# Patient Record
Sex: Female | Born: 1995 | Race: Black or African American | Hispanic: No | Marital: Single | Smoking: Never smoker
Health system: Southern US, Community
[De-identification: ages and names within clinical notes are randomized; demographics above are authoritative.]

## PROBLEM LIST (undated history)

## (undated) DIAGNOSIS — S83511A Sprain of anterior cruciate ligament of right knee, initial encounter: Secondary | ICD-10-CM

## (undated) DIAGNOSIS — Z98811 Dental restoration status: Secondary | ICD-10-CM

## (undated) DIAGNOSIS — S83249A Other tear of medial meniscus, current injury, unspecified knee, initial encounter: Secondary | ICD-10-CM

## (undated) HISTORY — PX: WISDOM TOOTH EXTRACTION: SHX21

---

## 2014-08-16 ENCOUNTER — Emergency Department (HOSPITAL_COMMUNITY)
Admission: EM | Admit: 2014-08-16 | Discharge: 2014-08-17 | Discharge status: Against Medical Advice | Payer: Medicaid Other | Attending: Emergency Medicine | Admitting: Emergency Medicine

## 2014-08-16 ENCOUNTER — Encounter (HOSPITAL_COMMUNITY): Payer: Self-pay | Admitting: Emergency Medicine

## 2014-08-16 DIAGNOSIS — R51 Headache: Secondary | ICD-10-CM | POA: Insufficient documentation

## 2014-08-16 LAB — COMPREHENSIVE METABOLIC PANEL
ALBUMIN: 4.5 g/dL (ref 3.5–5.0)
ALT: 21 U/L (ref 14–54)
ANION GAP: 11 (ref 5–15)
AST: 23 U/L (ref 15–41)
Alkaline Phosphatase: 51 U/L (ref 38–126)
BUN: 9 mg/dL (ref 6–20)
CALCIUM: 9.8 mg/dL (ref 8.9–10.3)
CO2: 25 mmol/L (ref 22–32)
CREATININE: 0.84 mg/dL (ref 0.44–1.00)
Chloride: 103 mmol/L (ref 101–111)
GFR calc Af Amer: >60 mL/min (ref >60)
GLUCOSE: 96 mg/dL (ref 65–99)
POTASSIUM: 3.6 mmol/L (ref 3.5–5.1)
SODIUM: 139 mmol/L (ref 135–145)
Total Bilirubin: 0.7 mg/dL (ref 0.3–1.2)
Total Protein: 7.5 g/dL (ref 6.5–8.1)

## 2014-08-16 LAB — CBC WITH DIFFERENTIAL/PLATELET
Basophils Absolute: 0 10*3/uL (ref 0.0–0.1)
Basophils Relative: 0 % (ref 0–1)
Eosinophils Absolute: 0.1 10*3/uL (ref 0.0–0.7)
Eosinophils Relative: 2 % (ref 0–5)
HCT: 36 % (ref 36.0–46.0)
Hemoglobin: 12.1 g/dL (ref 12.0–15.0)
Lymphocytes Relative: 57 % — ABNORMAL HIGH (ref 12–46)
Lymphs Abs: 3.1 10*3/uL (ref 0.7–4.0)
MCH: 27.6 pg (ref 26.0–34.0)
MCHC: 33.6 g/dL (ref 30.0–36.0)
MCV: 82.2 fL (ref 78.0–100.0)
MONO ABS: 0.3 10*3/uL (ref 0.1–1.0)
Monocytes Relative: 5 % (ref 3–12)
NEUTROS PCT: 36 % — AB (ref 43–77)
Neutro Abs: 1.9 10*3/uL (ref 1.7–7.7)
Platelets: 232 10*3/uL (ref 150–400)
RBC: 4.38 MIL/uL (ref 3.87–5.11)
RDW: 12.1 % (ref 11.5–15.5)
WBC: 5.3 10*3/uL (ref 4.0–10.5)

## 2014-08-16 LAB — URINALYSIS, ROUTINE W REFLEX MICROSCOPIC
Bilirubin Urine: NEGATIVE
Glucose, UA: NEGATIVE mg/dL
Hgb urine dipstick: NEGATIVE
KETONES UR: 15 mg/dL — AB
LEUKOCYTES UA: NEGATIVE
NITRITE: NEGATIVE
PROTEIN: NEGATIVE mg/dL
SPECIFIC GRAVITY, URINE: 1.026 (ref 1.005–1.030)
UROBILINOGEN UA: 1 mg/dL (ref 0.0–1.0)
pH: 7 (ref 5.0–8.0)

## 2014-08-16 LAB — POC URINE PREG, ED: PREG TEST UR: NEGATIVE

## 2014-08-16 NOTE — ED Notes (Addendum)
Pt. reports headache with nausea and photophobia onset today unrelieved by Aspirin , denies head injury / no blurred vision .

## 2014-08-17 NOTE — ED Notes (Signed)
Called pt to be brought back to room, no response X3.

## 2014-09-28 ENCOUNTER — Emergency Department (HOSPITAL_COMMUNITY)
Admission: EM | Admit: 2014-09-28 | Discharge: 2014-09-28 | Disposition: A | Discharge status: Home / Self Care | Payer: Medicaid Other | Attending: Emergency Medicine | Admitting: Emergency Medicine

## 2014-09-28 ENCOUNTER — Encounter (HOSPITAL_COMMUNITY): Payer: Self-pay

## 2014-09-28 DIAGNOSIS — Z3202 Encounter for pregnancy test, result negative: Secondary | ICD-10-CM | POA: Insufficient documentation

## 2014-09-28 DIAGNOSIS — N939 Abnormal uterine and vaginal bleeding, unspecified: Secondary | ICD-10-CM | POA: Diagnosis present

## 2014-09-28 DIAGNOSIS — R81 Glycosuria: Secondary | ICD-10-CM

## 2014-09-28 DIAGNOSIS — R739 Hyperglycemia, unspecified: Secondary | ICD-10-CM | POA: Diagnosis not present

## 2014-09-28 LAB — URINALYSIS, ROUTINE W REFLEX MICROSCOPIC
Bilirubin Urine: NEGATIVE
Glucose, UA: >1000 mg/dL — AB
NITRITE: NEGATIVE
PROTEIN: NEGATIVE mg/dL
Specific Gravity, Urine: 1.027 (ref 1.005–1.030)
Urobilinogen, UA: 0.2 mg/dL (ref 0.0–1.0)
pH: 5 (ref 5.0–8.0)

## 2014-09-28 LAB — URINE MICROSCOPIC-ADD ON

## 2014-09-28 LAB — WET PREP, GENITAL
CLUE CELLS WET PREP: NONE SEEN
TRICH WET PREP: NONE SEEN
Yeast Wet Prep HPF POC: NONE SEEN

## 2014-09-28 LAB — I-STAT BETA HCG BLOOD, ED (MC, WL, AP ONLY): I-stat hCG, quantitative: <5 m[IU]/mL (ref <5)

## 2014-09-28 LAB — CBG MONITORING, ED: GLUCOSE-CAPILLARY: 109 mg/dL — AB (ref 65–99)

## 2014-09-28 MED ORDER — IBUPROFEN 800 MG PO TABS: 800.0000 mg | ORAL_TABLET | Freq: Three times a day (TID) | ORAL | Status: DC

## 2014-09-28 MED ORDER — IBUPROFEN 800 MG PO TABS
800.0000 mg | ORAL_TABLET | Freq: Once | ORAL | Status: AC
  Administered 2014-09-28: 800 mg via ORAL
  Filled 2014-09-28: qty 1

## 2014-09-28 NOTE — ED Notes (Signed)
No period for three months and negative pregnancy test, today reports woke up with puddle of blood noted,denies clots. Cramps worse than normal also.

## 2014-09-28 NOTE — ED Notes (Signed)
Arthor Captain PA collected the wet prep genital

## 2014-09-28 NOTE — Discharge Instructions (Signed)
Abnormal Uterine Bleeding Abnormal uterine bleeding can affect women at various stages in life, including teenagers, women in their reproductive years, pregnant women, and women who have reached menopause. Several kinds of uterine bleeding are considered abnormal, including:  Bleeding or spotting between periods.   Bleeding after sexual intercourse.   Bleeding that is heavier or more than normal.   Periods that last longer than usual.  Bleeding after menopause.  Many cases of abnormal uterine bleeding are minor and simple to treat, while others are more serious. Any type of abnormal bleeding should be evaluated by your health care provider. Treatment will depend on the cause of the bleeding. HOME CARE INSTRUCTIONS Monitor your condition for any changes. The following actions may help to alleviate any discomfort you are experiencing:  Avoid the use of tampons and douches as directed by your health care provider.  Change your pads frequently. You should get regular pelvic exams and Pap tests. Keep all follow-up appointments for diagnostic tests as directed by your health care provider.  SEEK MEDICAL CARE IF:   Your bleeding lasts more than 1 week.   You feel dizzy at times.  SEEK IMMEDIATE MEDICAL CARE IF:   You pass out.   You are changing pads every 15 to 30 minutes.   You have abdominal pain.  You have a fever.   You become sweaty or weak.   You are passing large blood clots from the vagina.   You start to feel nauseous and vomit. MAKE SURE YOU:   Understand these instructions.  Will watch your condition.  Will get help right away if you are not doing well or get worse. Document Released: 12/19/2004 Document Revised: 12/24/2012 Document Reviewed: 07/18/2012 [facility] Patient Information 2015 [place], [place]. This information is not intended to replace advice given to you by your health care provider. Make sure you discuss any questions you have with your  health care provider.  Hyperglycemia Hyperglycemia occurs when the glucose (sugar) in your blood is too high. Hyperglycemia can happen for many reasons, but it most often happens to people who do not know they have diabetes or are not managing their diabetes properly.  CAUSES  Whether you have diabetes or not, there are other causes of hyperglycemia. Hyperglycemia can occur when you have diabetes, but it can also occur in other situations that you might not be as aware of, such as: Diabetes  If you have diabetes and are having problems controlling your blood glucose, hyperglycemia could occur because of some of the following reasons:  Not following your meal plan.  Not taking your diabetes medications or not taking it properly.  Exercising less or doing less activity than you normally do.  Being sick. Pre-diabetes  This cannot be ignored. Before people develop Type 2 diabetes, they almost always have "pre-diabetes." This is when your blood glucose levels are higher than normal, but not yet high enough to be diagnosed as diabetes. Research has shown that some long-term damage to the body, especially the heart and circulatory system, may already be occurring during pre-diabetes. If you take action to manage your blood glucose when you have pre-diabetes, you may delay or prevent Type 2 diabetes from developing. Stress  If you have diabetes, you may be "diet" controlled or on oral medications or insulin to control your diabetes. However, you may find that your blood glucose is higher than usual in the hospital whether you have diabetes or not. This is often referred to as "stress hyperglycemia." Stress  can elevate your blood glucose. This happens because of hormones put out by the body during times of stress. If stress has been the cause of your high blood glucose, it can be followed regularly by your caregiver. That way he/she can make sure your hyperglycemia does not continue to get worse or  progress to diabetes. Steroids  Steroids are medications that act on the infection fighting system (immune system) to block inflammation or infection. One side effect can be a rise in blood glucose. Most people can produce enough extra insulin to allow for this rise, but for those who cannot, steroids make blood glucose levels go even higher. It is not unusual for steroid treatments to "uncover" diabetes that is developing. It is not always possible to determine if the hyperglycemia will go away after the steroids are stopped. A special blood test called an A1c is sometimes done to determine if your blood glucose was elevated before the steroids were started. SYMPTOMS  Thirsty.  Frequent urination.  Dry mouth.  Blurred vision.  Tired or fatigue.  Weakness.  Sleepy.  Tingling in feet or leg. DIAGNOSIS  Diagnosis is made by monitoring blood glucose in one or all of the following ways:  A1c test. This is a chemical found in your blood.  Fingerstick blood glucose monitoring.  Laboratory results. TREATMENT  First, knowing the cause of the hyperglycemia is important before the hyperglycemia can be treated. Treatment may include, but is not be limited to:  Education.  Change or adjustment in medications.  Change or adjustment in meal plan.  Treatment for an illness, infection, etc.  More frequent blood glucose monitoring.  Change in exercise plan.  Decreasing or stopping steroids.  Lifestyle changes. HOME CARE INSTRUCTIONS   Test your blood glucose as directed.  Exercise regularly. Your caregiver will give you instructions about exercise. Pre-diabetes or diabetes which comes on with stress is helped by exercising.  Eat wholesome, balanced meals. Eat often and at regular, fixed times. Your caregiver or nutritionist will give you a meal plan to guide your sugar intake.  Being at an ideal weight is important. If needed, losing as little as 10 to 15 pounds may help improve  blood glucose levels. SEEK MEDICAL CARE IF:   You have questions about medicine, activity, or diet.  You continue to have symptoms (problems such as increased thirst, urination, or weight gain). SEEK IMMEDIATE MEDICAL CARE IF:   You are vomiting or have diarrhea.  Your breath smells fruity.  You are breathing faster or slower.  You are very sleepy or incoherent.  You have numbness, tingling, or pain in your feet or hands.  You have chest pain.  Your symptoms get worse even though you have been following your caregiver's orders.  If you have any other questions or concerns. Document Released: 06/14/2000 Document Revised: 03/13/2011 Document Reviewed: 04/17/2011 [facility] Patient Information 2015 [place], [place]. This information is not intended to replace advice given to you by your health care provider. Make sure you discuss any questions you have with your health care provider.

## 2014-09-28 NOTE — ED Provider Notes (Signed)
CSN: [id number]     Arrival date & time 09/28/14  4098 History   First MD Initiated Contact with Patient 09/28/14 [phone]     Chief Complaint  Patient presents with  . Abdominal Pain  . Vaginal Bleeding     (Consider location/radiation/quality/duration/timing/severity/associated sxs/prior Treatment) Patient is a 19 y.o. female presenting with abdominal pain and vaginal bleeding. The history is provided by the patient.  Abdominal Pain Pain location:  Suprapubic Pain quality: aching and cramping   Pain radiates to:  Does not radiate Pain severity:  Moderate Onset quality:  Sudden Duration:  3 hours Timing:  Constant Progression:  Unchanged Chronicity:  New Context: not alcohol use, not awakening from sleep, not diet changes, not eating, not laxative use, not medication withdrawal, not previous surgeries, not recent illness, not recent sexual activity, not recent travel, not retching, not sick contacts, not suspicious food intake and not trauma   Relieved by:  None tried Worsened by:  Nothing tried Ineffective treatments:  None tried Associated symptoms: vaginal bleeding   Associated symptoms: no anorexia, no belching, no chest pain, no chills, no constipation, no cough, no diarrhea, no dysuria, no fatigue, no fever, no flatus, no hematemesis, no hematochezia, no hematuria, no melena, no nausea, no shortness of breath, no sore throat, no vaginal discharge and no vomiting   Vaginal bleeding:    Quality:  Bright red   Severity:  Moderate   Number of tampons used:  2 since onset thisw morning   Onset quality:  Sudden   Duration:  3 hours   Progression:  Unchanged   Chronicity:  New Risk factors: no alcohol abuse, no aspirin use, not elderly, has not had multiple surgeries, no NSAID use, not obese, not pregnant and no recent hospitalization   Vaginal Bleeding Menstrual history:  Missed period (3months no period) Possible pregnancy: no   Context: spontaneously   Associated symptoms:  abdominal pain   Associated symptoms: no dysuria, no fatigue, no fever, no nausea and no vaginal discharge   Risk factors: no bleeding disorder, no hx of ectopic pregnancy, no hx of endometriosis, no gynecological surgery, does not have multiple partners, no new sexual partner, no ovarian cysts, no ovarian torsion, no PID, no prior miscarriage, no STD, no STD exposure and no terminated pregnancies     History reviewed. No pertinent past medical history. History reviewed. No pertinent past surgical history. History reviewed. No pertinent family history. Social History  Substance Use Topics  . Smoking status: Never Smoker   . Smokeless tobacco: None  . Alcohol Use: No   OB History    No data available     Review of Systems  Constitutional: Negative for fever, chills and fatigue.  HENT: Negative for sore throat.   Respiratory: Negative for cough and shortness of breath.   Cardiovascular: Negative for chest pain.  Gastrointestinal: Positive for abdominal pain. Negative for nausea, vomiting, diarrhea, constipation, melena, hematochezia, anorexia, flatus and hematemesis.  Genitourinary: Positive for frequency and vaginal bleeding. Negative for dysuria, hematuria and vaginal discharge.  All other systems reviewed and are negative.     Allergies  Review of patient's allergies indicates no known allergies.  Home Medications   Prior to Admission medications   Medication Sig Start Date End Date Taking? Authorizing Provider  ibuprofen (ADVIL,MOTRIN) 800 MG tablet Take 1 tablet (800 mg total) by mouth 3 (three) times daily. 09/28/14   Callista Hoh, PA-C   BP 122/59 mmHg  Pulse 84  Temp(Src) 98.3 F (36.8  C) (Oral)  Resp 16  Ht  (1.626 m)  Wt 150 lb (68.04 kg)  BMI 25.73 kg/m2  SpO2 98% Physical Exam  Constitutional: She is oriented to person, place, and time. She appears well-developed and well-nourished. No distress.  HENT:  Head: Normocephalic and atraumatic.  Eyes:  Conjunctivae are normal. No scleral icterus.  Neck: Normal range of motion.  Cardiovascular: Normal rate, regular rhythm and normal heart sounds.  Exam reveals no gallop and no friction rub.   No murmur heard. Pulmonary/Chest: Effort normal and breath sounds normal. No respiratory distress.  Abdominal: Soft. Bowel sounds are normal. She exhibits no distension and no mass. There is no tenderness. There is no guarding.  Genitourinary:  Pelvic exam: normal external genitalia, vulva, vagina, cervix, uterus and adnexa. Generalized pain and discomfort. No adnexal pain/fullness, no CMT. minmal bleeding from the cervical os   Neurological: She is alert and oriented to person, place, and time.  Skin: Skin is warm and dry. She is not diaphoretic.    ED Course  Procedures (including critical care time) Labs Review Labs Reviewed  WET PREP, GENITAL - Abnormal; Notable for the following:    WBC, Wet Prep HPF POC FEW (*)    All other components within normal limits  URINALYSIS, ROUTINE W REFLEX MICROSCOPIC (NOT AT [facility]) - Abnormal; Notable for the following:    APPearance CLOUDY (*)    Glucose, UA >1000 (*)    Hgb urine dipstick SMALL (*)    Ketones, ur >80 (*)    Leukocytes, UA SMALL (*)    All other components within normal limits  URINE MICROSCOPIC-ADD ON - Abnormal; Notable for the following:    Squamous Epithelial / LPF FEW (*)    All other components within normal limits  CBG MONITORING, ED - Abnormal; Notable for the following:    Glucose-Capillary 109 (*)    All other components within normal limits  I-STAT BETA HCG BLOOD, ED ([facility], [facility], [facility] ONLY)  I-STAT BETA HCG BLOOD, ED ([facility], [facility], [facility] ONLY)  GC/CHLAMYDIA PROBE AMP ([place]) NOT AT [facility]    Imaging Review No results found. I have personally reviewed and evaluated these images and lab results as part of my medical decision-making.   EKG Interpretation None      MDM   Final diagnoses:  Abnormal vaginal bleeding  Hyperglycemia   Glucosuria    19year-old female who presents emergency Department with chief complaint of cramping and vaginal bleeding. Patient states that she had heavy vaginal bleeding that began this morning. She states that she has already bled through 2 super tampons this morning. She states that these cramps are worse than other previous menstrual cramps that she's had. She denies any previous symptoms such as vaginal discharge, foul odor, dyspareunia. She states that she has not had a period in 3 months, but has no previous history of abnormal vaginal bleeding. She denies any urinary symptoms except for frequency of urination. She states she has had previous urinary tract infections, but this does not feel like she has one. She denies fevers, chills, myalgias.  Patient has a benign pelvic examination. Urine appears contaminated. She does have high glucose urea. CBG reveals an elevated glucose at 109. I discussed these findings with the patient. She states she has a follow-up appointment with her primary care physician this coming Friday in 4 days. I did advise her that she will need further evaluation of her blood sugar.patient appears safe for discharge, I have discussed  return precautio  Arthor Captain, PA-C 09/28/14 1956  Gilda Crease, MD 09/29/14 2052

## 2014-09-29 LAB — GC/CHLAMYDIA PROBE AMP (~~LOC~~) NOT AT ARMC
Chlamydia: NEGATIVE
Neisseria Gonorrhea: NEGATIVE

## 2014-11-03 DIAGNOSIS — S83511A Sprain of anterior cruciate ligament of right knee, initial encounter: Secondary | ICD-10-CM

## 2014-11-03 DIAGNOSIS — S83249A Other tear of medial meniscus, current injury, unspecified knee, initial encounter: Secondary | ICD-10-CM

## 2014-11-03 HISTORY — DX: Other tear of medial meniscus, current injury, unspecified knee, initial encounter: S83.249A

## 2014-11-03 HISTORY — DX: Sprain of anterior cruciate ligament of right knee, initial encounter: S83.511A

## 2014-11-13 ENCOUNTER — Other Ambulatory Visit: Payer: Self-pay | Admitting: Orthopedic Surgery

## 2014-11-16 ENCOUNTER — Encounter (HOSPITAL_BASED_OUTPATIENT_CLINIC_OR_DEPARTMENT_OTHER): Payer: Self-pay | Admitting: *Deleted

## 2014-11-20 NOTE — H&P (Signed)
Amy Neal is an 19 y.o. female.    Chief Complaint: Left ACL tear  HPI: Patient returns today with continued instability of her right knee.  An MRI scan is been accomplished showing a chronic ACL deficiency as well as what looks like a markedly diminutive medial meniscus and peripheral longitudinal tear of the medial meniscus.  At age 19.  The patient desires to be active and is interested in reconstruction.  Past Medical History  Diagnosis Date  . Right ACL tear 11/2014  . Medial meniscus tear 11/2014    right knee  . Dental crowns present     also dental caps    Past Surgical History  Procedure Laterality Date  . Wisdom tooth extraction      History reviewed. No pertinent family history. Social History:  reports that she has never smoked. She has never used smokeless tobacco. She reports that she uses illicit drugs (Marijuana). She reports that she does not drink alcohol.  Allergies:  Allergies  Allergen Reactions  . Shrimp [Shellfish Allergy] Hives    TONGUE SWELLING, THROAT ITCHING    No prescriptions prior to admission    No results found for this or any previous visit (from the past 48 hour(s)). No results found.  Review of Systems  Constitutional: Positive for malaise/fatigue.  Eyes: Negative.   Respiratory: Positive for shortness of breath.   Cardiovascular: Negative.   Gastrointestinal: Positive for nausea, vomiting and abdominal pain.       Poor appetite, bowel changes  Genitourinary: Positive for frequency.  Musculoskeletal: Positive for myalgias and joint pain.  Skin: Negative.   Neurological: Positive for headaches.  Endo/Heme/Allergies: Negative.   Psychiatric/Behavioral: Negative.     Height 5\' 4"  (1.626 m), weight 69.854 kg (154 lb), last menstrual period 11/09/2014. Physical Exam  Constitutional: She is oriented to person, place, and time. She appears well-developed and well-nourished.  HENT:  Head: Normocephalic and atraumatic.  Eyes:  Pupils are equal, round, and reactive to light.  Neck: Normal range of motion. Neck supple.  Cardiovascular: Intact distal pulses.   Respiratory: Effort normal.  Musculoskeletal: She exhibits tenderness.  Lachman's test remains negative.  Tender along the medial joint line the MRI scan is reviewed in detail with the patient.  Neurological: She is alert and oriented to person, place, and time.  Skin: Skin is warm and dry.  Psychiatric: She has a normal mood and affect. Her behavior is normal. Judgment and thought content normal.     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  Plan: Risks and benefits of autograft ACL reconstruction were discussed at length with the patient.  We'll get this arranged for her at her convenience.  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  She understands the need for physical therapy afterwards, and we'll need 3-4 months for healing total.  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  Amy Neal 11/20/2014, 10:06 AM

## 2014-11-23 ENCOUNTER — Encounter (HOSPITAL_BASED_OUTPATIENT_CLINIC_OR_DEPARTMENT_OTHER): Payer: Self-pay | Admitting: Anesthesiology

## 2014-11-23 ENCOUNTER — Ambulatory Visit (HOSPITAL_BASED_OUTPATIENT_CLINIC_OR_DEPARTMENT_OTHER): Payer: Medicaid Other | Admitting: Certified Registered"

## 2014-11-23 ENCOUNTER — Ambulatory Visit (HOSPITAL_BASED_OUTPATIENT_CLINIC_OR_DEPARTMENT_OTHER)
Admission: RE | Admit: 2014-11-23 | Discharge: 2014-11-23 | Disposition: A | Discharge status: Home / Self Care | Payer: Medicaid Other | Source: Ambulatory Visit | Attending: Orthopedic Surgery | Admitting: Orthopedic Surgery

## 2014-11-23 ENCOUNTER — Encounter (HOSPITAL_BASED_OUTPATIENT_CLINIC_OR_DEPARTMENT_OTHER)
Admission: RE | Disposition: A | Discharge status: Home / Self Care | Payer: Self-pay | Source: Ambulatory Visit | Attending: Orthopedic Surgery

## 2014-11-23 DIAGNOSIS — S83241A Other tear of medial meniscus, current injury, right knee, initial encounter: Secondary | ICD-10-CM | POA: Diagnosis not present

## 2014-11-23 DIAGNOSIS — S83511A Sprain of anterior cruciate ligament of right knee, initial encounter: Secondary | ICD-10-CM | POA: Diagnosis present

## 2014-11-23 DIAGNOSIS — Z91013 Allergy to seafood: Secondary | ICD-10-CM | POA: Diagnosis not present

## 2014-11-23 HISTORY — DX: Sprain of anterior cruciate ligament of right knee, initial encounter: S83.511A

## 2014-11-23 HISTORY — DX: Other tear of medial meniscus, current injury, unspecified knee, initial encounter: S83.249A

## 2014-11-23 HISTORY — PX: KNEE ARTHROSCOPY WITH ANTERIOR CRUCIATE LIGAMENT (ACL) REPAIR: SHX5644

## 2014-11-23 HISTORY — DX: Dental restoration status: Z98.811

## 2014-11-23 HISTORY — PX: KNEE ARTHROSCOPY WITH MEDIAL MENISECTOMY: SHX5651

## 2014-11-23 SURGERY — KNEE ARTHROSCOPY WITH ANTERIOR CRUCIATE LIGAMENT (ACL) REPAIR
Anesthesia: Regional | Site: Knee | Laterality: Right

## 2014-11-23 MED ORDER — ONDANSETRON 8 MG PO TBDP: 8.0000 mg | ORAL_TABLET | Freq: Once | ORAL | Status: DC

## 2014-11-23 MED ORDER — MEPERIDINE HCL 25 MG/ML IJ SOLN: 6.2500 mg | INTRAMUSCULAR | Status: DC | PRN

## 2014-11-23 MED ORDER — GLYCOPYRROLATE 0.2 MG/ML IJ SOLN: 0.2000 mg | Freq: Once | INTRAMUSCULAR | Status: DC | PRN

## 2014-11-23 MED ORDER — FENTANYL CITRATE (PF) 100 MCG/2ML IJ SOLN
50.0000 ug | INTRAMUSCULAR | Status: AC | PRN
  Administered 2014-11-23: 100 ug via INTRAVENOUS
  Administered 2014-11-23 (×2): 50 ug via INTRAVENOUS

## 2014-11-23 MED ORDER — BUPIVACAINE-EPINEPHRINE (PF) 0.5% -1:200000 IJ SOLN
INTRAMUSCULAR | Status: AC
  Filled 2014-11-23: qty 30

## 2014-11-23 MED ORDER — MIDAZOLAM HCL 2 MG/2ML IJ SOLN
INTRAMUSCULAR | Status: AC
  Filled 2014-11-23: qty 2

## 2014-11-23 MED ORDER — CHLORHEXIDINE GLUCONATE 4 % EX LIQD: 60.0000 mL | Freq: Once | CUTANEOUS | Status: DC

## 2014-11-23 MED ORDER — MORPHINE SULFATE (PF) 10 MG/ML IV SOLN
INTRAVENOUS | Status: AC
  Filled 2014-11-23: qty 1

## 2014-11-23 MED ORDER — HYDROMORPHONE HCL 1 MG/ML IJ SOLN
INTRAMUSCULAR | Status: AC
  Filled 2014-11-23: qty 1

## 2014-11-23 MED ORDER — CEFAZOLIN SODIUM-DEXTROSE 2-3 GM-% IV SOLR
2.0000 g | INTRAVENOUS | Status: AC
  Administered 2014-11-23: 2 g via INTRAVENOUS

## 2014-11-23 MED ORDER — ONDANSETRON 8 MG PO TBDP
8.0000 mg | ORAL_TABLET | Freq: Once | ORAL | Status: AC
  Administered 2014-11-23: 8 mg via ORAL

## 2014-11-23 MED ORDER — OXYCODONE-ACETAMINOPHEN 5-325 MG PO TABS: 1.0000 | ORAL_TABLET | ORAL | Status: DC | PRN

## 2014-11-23 MED ORDER — PROMETHAZINE HCL 25 MG/ML IJ SOLN: 6.2500 mg | INTRAMUSCULAR | Status: DC | PRN

## 2014-11-23 MED ORDER — DEXAMETHASONE SODIUM PHOSPHATE 4 MG/ML IJ SOLN
INTRAMUSCULAR | Status: DC | PRN
  Administered 2014-11-23: 10 mg via INTRAVENOUS

## 2014-11-23 MED ORDER — PROPOFOL 10 MG/ML IV BOLUS
INTRAVENOUS | Status: DC | PRN
  Administered 2014-11-23: 200 mg via INTRAVENOUS

## 2014-11-23 MED ORDER — ONDANSETRON HCL 4 MG PO TABS: 4.0000 mg | ORAL_TABLET | Freq: Four times a day (QID) | ORAL | Status: DC | PRN

## 2014-11-23 MED ORDER — LACTATED RINGERS IV SOLN
INTRAVENOUS | Status: DC
  Administered 2014-11-23: 09:00:00 via INTRAVENOUS

## 2014-11-23 MED ORDER — MIDAZOLAM HCL 2 MG/2ML IJ SOLN
1.0000 mg | INTRAMUSCULAR | Status: DC | PRN
  Administered 2014-11-23 (×2): 2 mg via INTRAVENOUS

## 2014-11-23 MED ORDER — HYDROMORPHONE HCL 1 MG/ML IJ SOLN: 0.2500 mg | INTRAMUSCULAR | Status: DC | PRN

## 2014-11-23 MED ORDER — FENTANYL CITRATE (PF) 100 MCG/2ML IJ SOLN
INTRAMUSCULAR | Status: AC
  Filled 2014-11-23: qty 2

## 2014-11-23 MED ORDER — HYDROMORPHONE HCL 1 MG/ML IJ SOLN
0.2500 mg | INTRAMUSCULAR | Status: DC | PRN
  Administered 2014-11-23 (×3): 0.5 mg via INTRAVENOUS

## 2014-11-23 MED ORDER — SCOPOLAMINE 1 MG/3DAYS TD PT72: 1.0000 | MEDICATED_PATCH | Freq: Once | TRANSDERMAL | Status: DC | PRN

## 2014-11-23 MED ORDER — BUPIVACAINE HCL (PF) 0.5 % IJ SOLN
INTRAMUSCULAR | Status: AC
  Filled 2014-11-23: qty 30

## 2014-11-23 MED ORDER — ONDANSETRON HCL 4 MG/2ML IJ SOLN: 4.0000 mg | Freq: Four times a day (QID) | INTRAMUSCULAR | Status: DC | PRN

## 2014-11-23 MED ORDER — LIDOCAINE HCL (CARDIAC) 20 MG/ML IV SOLN
INTRAVENOUS | Status: DC | PRN
  Administered 2014-11-23: 50 mg via INTRAVENOUS

## 2014-11-23 MED ORDER — METOCLOPRAMIDE HCL 5 MG PO TABS: 5.0000 mg | ORAL_TABLET | Freq: Three times a day (TID) | ORAL | Status: DC | PRN

## 2014-11-23 MED ORDER — METOCLOPRAMIDE HCL 5 MG/ML IJ SOLN: 5.0000 mg | Freq: Three times a day (TID) | INTRAMUSCULAR | Status: DC | PRN

## 2014-11-23 MED ORDER — EPINEPHRINE HCL 1 MG/ML IJ SOLN
INTRAMUSCULAR | Status: AC
  Filled 2014-11-23: qty 1

## 2014-11-23 MED ORDER — ONDANSETRON HCL 4 MG/2ML IJ SOLN
INTRAMUSCULAR | Status: DC | PRN
  Administered 2014-11-23: 4 mg via INTRAVENOUS

## 2014-11-23 MED ORDER — MORPHINE SULFATE (PF) 10 MG/ML IV SOLN
INTRAVENOUS | Status: DC | PRN
  Administered 2014-11-23 (×2): 2 mg via BUCCAL

## 2014-11-23 MED ORDER — CEFAZOLIN SODIUM-DEXTROSE 2-3 GM-% IV SOLR
INTRAVENOUS | Status: AC
  Filled 2014-11-23: qty 50

## 2014-11-23 MED ORDER — SODIUM CHLORIDE 0.9 % IR SOLN
Status: DC | PRN
  Administered 2014-11-23: 12:00:00

## 2014-11-23 MED ORDER — ONDANSETRON 8 MG PO TBDP
ORAL_TABLET | ORAL | Status: AC
  Filled 2014-11-23: qty 1

## 2014-11-23 SURGICAL SUPPLY — 67 items
BANDAGE ELASTIC 4 VELCRO ST LF (GAUZE/BANDAGES/DRESSINGS) ×4 IMPLANT
BANDAGE ELASTIC 6 VELCRO ST LF (GAUZE/BANDAGES/DRESSINGS) ×4 IMPLANT
BANDAGE ESMARK 6X9 LF (GAUZE/BANDAGES/DRESSINGS) ×2 IMPLANT
BLADE AVERAGE 25MMX9MM (BLADE) ×1
BLADE AVERAGE 25X9 (BLADE) ×3 IMPLANT
BLADE CUTTER GATOR 3.5 (BLADE) ×4 IMPLANT
BLADE GREAT WHITE 4.2 (BLADE) ×3 IMPLANT
BLADE GREAT WHITE 4.2MM (BLADE) ×1
BLADE SURG 10 STRL SS (BLADE) ×4 IMPLANT
BLADE SURG 15 STRL LF DISP TIS (BLADE) IMPLANT
BLADE SURG 15 STRL SS (BLADE)
BNDG COHESIVE 6X5 TAN STRL LF (GAUZE/BANDAGES/DRESSINGS) ×4 IMPLANT
BNDG ESMARK 6X9 LF (GAUZE/BANDAGES/DRESSINGS) ×4
BUR VERTEX HOODED 4.5 (BURR) ×4 IMPLANT
COVER BACK TABLE 60X90IN (DRAPES) ×4 IMPLANT
CUFF TOURNIQUET SINGLE 34IN LL (TOURNIQUET CUFF) ×4 IMPLANT
DECANTER SPIKE VIAL GLASS SM (MISCELLANEOUS) IMPLANT
DRAPE ARTHROSCOPY W/POUCH 114 (DRAPES) ×4 IMPLANT
DRAPE U-SHAPE 47X51 STRL (DRAPES) IMPLANT
DURAPREP 26ML APPLICATOR (WOUND CARE) ×4 IMPLANT
ELECT REM PT RETURN 9FT ADLT (ELECTROSURGICAL) ×4
ELECTRODE REM PT RTRN 9FT ADLT (ELECTROSURGICAL) ×2 IMPLANT
GAUZE SPONGE 4X4 12PLY STRL (GAUZE/BANDAGES/DRESSINGS) ×4 IMPLANT
GAUZE XEROFORM 1X8 LF (GAUZE/BANDAGES/DRESSINGS) ×4 IMPLANT
GLOVE BIO SURGEON STRL SZ7.5 (GLOVE) ×4 IMPLANT
GLOVE BIO SURGEON STRL SZ8.5 (GLOVE) ×4 IMPLANT
GLOVE BIOGEL PI IND STRL 8 (GLOVE) ×2 IMPLANT
GLOVE BIOGEL PI IND STRL 9 (GLOVE) ×2 IMPLANT
GLOVE BIOGEL PI INDICATOR 8 (GLOVE) ×2
GLOVE BIOGEL PI INDICATOR 9 (GLOVE) ×2
GOWN STRL REUS W/ TWL LRG LVL3 (GOWN DISPOSABLE) ×4 IMPLANT
GOWN STRL REUS W/TWL LRG LVL3 (GOWN DISPOSABLE) ×4
GOWN STRL REUS W/TWL XL LVL3 (GOWN DISPOSABLE) ×4 IMPLANT
IMMOBILIZER KNEE 22 UNIV (SOFTGOODS) ×4 IMPLANT
IMMOBILIZER KNEE 24 THIGH 36 (MISCELLANEOUS) IMPLANT
IMMOBILIZER KNEE 24 UNIV (MISCELLANEOUS)
IV NS IRRIG 3000ML ARTHROMATIC (IV SOLUTION) ×8 IMPLANT
KNEE WRAP E Z 3 GEL PACK (MISCELLANEOUS) ×4 IMPLANT
MANIFOLD NEPTUNE II (INSTRUMENTS) ×4 IMPLANT
NDL SAFETY ECLIPSE 18X1.5 (NEEDLE) ×2 IMPLANT
NEEDLE HYPO 18GX1.5 SHARP (NEEDLE) ×2
NS IRRIG 1000ML POUR BTL (IV SOLUTION) ×4 IMPLANT
PACK ARTHROSCOPY DSU (CUSTOM PROCEDURE TRAY) ×4 IMPLANT
PAD CAST 4YDX4 CTTN HI CHSV (CAST SUPPLIES) ×2 IMPLANT
PADDING CAST COTTON 4X4 STRL (CAST SUPPLIES) ×2
PENCIL BUTTON HOLSTER BLD 10FT (ELECTRODE) ×4 IMPLANT
SCREW PROPEL 8X20 (Screw) ×8 IMPLANT
SET ARTHROSCOPY TUBING (MISCELLANEOUS) ×2
SET ARTHROSCOPY TUBING LN (MISCELLANEOUS) ×2 IMPLANT
SHEET MEDIUM DRAPE 40X70 STRL (DRAPES) ×4 IMPLANT
SLEEVE SCD COMPRESS KNEE MED (MISCELLANEOUS) ×4 IMPLANT
SPONGE LAP 4X18 X RAY DECT (DISPOSABLE) ×8 IMPLANT
SUCTION FRAZIER TIP 10 FR DISP (SUCTIONS) IMPLANT
SUT FIBERWIRE #2 38 T-5 BLUE (SUTURE) ×4
SUT PDS AB 4-0 P3 18 (SUTURE) IMPLANT
SUT STEEL 5 (SUTURE) ×4 IMPLANT
SUT VIC AB 2-0 SH 27 (SUTURE)
SUT VIC AB 2-0 SH 27XBRD (SUTURE) IMPLANT
SUT VIC AB 3-0 PS1 18 (SUTURE) ×2
SUT VIC AB 3-0 PS1 18XBRD (SUTURE) ×2 IMPLANT
SUTURE FIBERWR #2 38 T-5 BLUE (SUTURE) ×2 IMPLANT
SYR 5ML LL (SYRINGE) ×4 IMPLANT
TOWEL OR 17X24 6PK STRL BLUE (TOWEL DISPOSABLE) ×12 IMPLANT
TOWEL OR NON WOVEN STRL DISP B (DISPOSABLE) ×4 IMPLANT
WAND STAR VAC 90 (SURGICAL WAND) ×4 IMPLANT
WATER STERILE IRR 1000ML POUR (IV SOLUTION) ×4 IMPLANT
YANKAUER SUCT BULB TIP NO VENT (SUCTIONS) IMPLANT

## 2014-11-23 NOTE — Transfer of Care (Signed)
Immediate Anesthesia Transfer of Care Note  Patient: Amy Neal  Procedure(s) Performed: Procedure(s): KNEE ARTHROSCOPY WITH ANTERIOR CRUCIATE LIGAMENT (ACL) REPAIR PARTIAL INTERIOR MEDIAL MENISCECTOMY (Right) KNEE ARTHROSCOPY WITH MEDIAL MENISECTOMY (Left)  Patient Location: PACU  Anesthesia Type:GA combined with regional for post-op pain  Level of Consciousness: awake and patient cooperative  Airway & Oxygen Therapy: Patient Spontanous Breathing and Patient connected to face mask oxygen  Post-op Assessment: Report given to RN and Post -op Vital signs reviewed and stable  Post vital signs: Reviewed and stable  Last Vitals:  Filed Vitals:   11/23/14 0920 11/23/14 0925  BP:  108/73  Pulse:    Temp:    Resp: 9     Complications: No apparent anesthesia complications

## 2014-11-23 NOTE — Anesthesia Postprocedure Evaluation (Signed)
Anesthesia Post Note  Patient: Amy Neal  Procedure(s) Performed: Procedure(s) (LRB): KNEE ARTHROSCOPY WITH ANTERIOR CRUCIATE LIGAMENT (ACL) REPAIR PARTIAL INTERIOR MEDIAL MENISCECTOMY (Right) KNEE ARTHROSCOPY WITH MEDIAL MENISECTOMY (Left)  Patient location during evaluation: PACU Anesthesia Type: General and Regional Level of consciousness: sedated and patient cooperative Pain management: pain level controlled Vital Signs Assessment: post-procedure vital signs reviewed and stable Respiratory status: spontaneous breathing Cardiovascular status: stable Anesthetic complications: no    Last Vitals:  Filed Vitals:   11/23/14 1330 11/23/14 1345  BP: 111/77 114/66  Pulse: 70   Temp:    Resp: 21 21    Last Pain:  Filed Vitals:   11/23/14 1349  PainSc: 3         RLE Motor Response: Purposeful movement RLE Sensation: No sensation (absent) (due to block)      Lewie LoronJohn Ashanti Ratti

## 2014-11-23 NOTE — Anesthesia Procedure Notes (Addendum)
Anesthesia Regional Block:  Femoral nerve block  Pre-Anesthetic Checklist: ,, timeout performed, Correct Patient, Correct Site, Correct Laterality, Correct Procedure, Correct Position, site marked, Risks and benefits discussed, Surgical consent,  Pre-op evaluation,  Post-op pain management  Laterality: Right  Prep: chloraprep       Needles:  Injection technique: Single-shot  Needle Type: Stimulator Needle - 80     Needle Length: 9cm 9 cm Needle Gauge: 22 and 22 G  Needle insertion depth: 6 cm   Additional Needles:  Procedures: ultrasound guided (picture in chart) and nerve stimulator Femoral nerve block  Nerve Stimulator or Paresthesia:  Response: Patellar snap, 0.5 mA,   Additional Responses:   Narrative:  Injection made incrementally with aspirations every 5 mL.  Performed by: Personally  Anesthesiologist: Lewie LoronGERMEROTH, JOHN  Additional Notes: BP cuff, EKG monitors applied. Sedation begun. Femoral artery palpated for location of nerve. After nerve location verified with U/S, anesthetic injected incrementally, slowly, and after negative aspirations under direct u/s guidance. Good perineural spread. Patient tolerated well.   Procedure Name: LMA Insertion Performed by: York GricePEARSON, Adeliz Tonkinson W Pre-anesthesia Checklist: Patient identified, Emergency Drugs available, Suction available and Patient being monitored Patient Re-evaluated:Patient Re-evaluated prior to inductionOxygen Delivery Method: Circle System Utilized Preoxygenation: Pre-oxygenation with 100% oxygen Intubation Type: IV induction Ventilation: Mask ventilation without difficulty LMA: LMA inserted LMA Size: 5.0 Number of attempts: 1 Airway Equipment and Method: Bite block Placement Confirmation: positive ETCO2 Tube secured with: Tape Dental Injury: Teeth and Oropharynx as per pre-operative assessment

## 2014-11-23 NOTE — Progress Notes (Signed)
Assisted Dr. Germeroth with right, ultrasound guided, femoral block. Side rails up, monitors on throughout procedure. See vital signs in flow sheet. Tolerated Procedure well. 

## 2014-11-23 NOTE — Anesthesia Preprocedure Evaluation (Addendum)
Anesthesia Evaluation  Patient identified by MRN, date of birth, ID band Patient awake    Reviewed: Allergy & Precautions, NPO status , Patient's Chart, lab work & pertinent test results  Airway Mallampati: II  TM Distance: >3 FB Neck ROM: Full    Dental no notable dental hx.    Pulmonary neg pulmonary ROS,    Pulmonary exam normal breath sounds clear to auscultation       Cardiovascular negative cardio ROS Normal cardiovascular exam Rhythm:Regular Rate:Normal     Neuro/Psych negative neurological ROS  negative psych ROS   GI/Hepatic negative GI ROS, Neg liver ROS,   Endo/Other  negative endocrine ROS  Renal/GU negative Renal ROS     Musculoskeletal negative musculoskeletal ROS (+)   Abdominal   Peds  Hematology negative hematology ROS (+)   Anesthesia Other Findings   Reproductive/Obstetrics negative OB ROS                             Anesthesia Physical Anesthesia Plan  ASA: II  Anesthesia Plan: General and Regional   Post-op Pain Management:    Induction: Intravenous  Airway Management Planned: Oral ETT  Additional Equipment:   Intra-op Plan:   Post-operative Plan: Extubation in OR  Informed Consent: I have reviewed the patients History and Physical, chart, labs and discussed the procedure including the risks, benefits and alternatives for the proposed anesthesia with the patient or authorized representative who has indicated his/her understanding and acceptance.   Dental advisory given  Plan Discussed with: CRNA  Anesthesia Plan Comments:         Anesthesia Quick Evaluation  

## 2014-11-23 NOTE — Discharge Instructions (Addendum)
Anterior Cruciate Ligament Reconstruction Anterior cruciate ligament (ACL) reconstruction is a surgical procedure to replace a torn ACL. A ligament is a strong, fibrous, cordlike tissue that connects your bones. Your ACL guides your shin bone (tibia) as it moves in your knee joint. When your ACL is torn, your knee joint loses stability. During ACL reconstruction, a tissue from a ligament from another part of your body or from a donor (graft) is used to replace your torn ACL. LET [facility] PROVIDER KNOW ABOUT:   Any allergies you have.  Any medicine you are taking, including vitamins, herbs, eye drops, over-the-counter medicines, and creams.  Problems you have had with numbing medicines (local anesthetics) or medicines that make you sleep (general anesthetics).  A family history of problems with anesthetics.  Any blood disorders you have or history of excessive bleeding you have.  Previous surgeries you have had.  Any history of tobacco use.  Any long-term health conditions you have, such as diabetes. RISKS AND COMPLICATIONS Generally, ACL reconstruction is a safe procedure. The following complications are very rare, but they can occur:  Infection.  Failure or repeated rupture of the reconstructed ligament.  Knee stiffness. BEFORE THE PROCEDURE  Your health care provider will instruct you when you need to stop eating and drinking.  Ask your health care provider if you need to change or stop taking any medicines that you regularly take. PROCEDURE  A small incision will be made in your knee. A thin, flexible, tube-like surgical instrument with a camera on the end (arthroscope) will be inserted into your knee joint. Your torn ACL will be removed with the arthroscope. The graft will be inserted into your knee joint with the arthroscope. Guides will be used to place the graft in the proper position. Tunnels in your upper tibia and femur will be created with large drills, which are  placed over guidewires. The graft will be pulled into the tunnels on both your femur and your tibia. The graft will be secured in place in the tunnels with screws.  AFTER THE PROCEDURE You will be taken to the recovery area where a nurse will watch and check your progress. You will be allowed to leave with a family member or other adult who will stay with you once your health care provider feels it is safe for you to go home.    This information is not intended to replace advice given to you by your health care provider. Make sure you discuss any questions you have with your health care provider.   Document Released: 10/19/2003 Document Revised: 01/09/2014 Document Reviewed: 05/08/2011 Elsevier Interactive Patient Education 2016 [employer]  Regional Anesthesia Blocks  1. Numbness or the inability to move the "blocked" extremity may last from 3-48 hours after placement. The length of time depends on the medication injected and your individual response to the medication. If the numbness is not going away after 48 hours, call your surgeon.  2. The extremity that is blocked will need to be protected until the numbness is gone and the  Strength has returned. Because you cannot feel it, you will need to take extra care to avoid injury. Because it may be weak, you may have difficulty moving it or using it. You may not know what position it is in without looking at it while the block is in effect.  3. For blocks in the legs and feet, returning to weight bearing and walking needs to be done carefully. You will need  to wait until the numbness is entirely gone and the strength has returned. You should be able to move your leg and foot normally before you try and bear weight or walk. You will need someone to be with you when you first try to ensure you do not fall and possibly risk injury.  4. Bruising and tenderness at the needle site are common side effects and will resolve in a few days.  5. Persistent  numbness or new problems with movement should be communicated to the surgeon or the [facility] [phone] [facility] [phone]).  Post Anesthesia Home Care Instructions  Activity: Get plenty of rest for the remainder of the day. A responsible adult should stay with you for 24 hours following the procedure.  For the next 24 hours, DO NOT: -Drive a car -Advertising copywriter -Drink alcoholic beverages -Take any medication unless instructed by your physician -Make any legal decisions or sign important papers.  Meals: Start with liquid foods such as gelatin or soup. Progress to regular foods as tolerated. Avoid greasy, spicy, heavy foods. If nausea and/or vomiting occur, drink only clear liquids until the nausea and/or vomiting subsides. Call your physician if vomiting continues.  Special Instructions/Symptoms: Your throat may feel dry or sore from the anesthesia or the breathing tube placed in your throat during surgery. If this causes discomfort, gargle with warm salt water. The discomfort should disappear within 24 hours.  If you had a scopolamine patch placed behind your ear for the management of post- operative nausea and/or vomiting:  1. The medication in the patch is effective for 72 hours, after which it should be removed.  Wrap patch in a tissue and discard in the trash. Wash hands thoroughly with soap and water. 2. You may remove the patch earlier than 72 hours if you experience unpleasant side effects which may include dry mouth, dizziness or visual disturbances. 3. Avoid touching the patch. Wash your hands with soap and water after contact with the patch.

## 2014-11-23 NOTE — Interval H&P Note (Signed)
History and Physical Interval Note:  11/23/2014 10:24 AM  Amy Neal  has presented today for surgery, with the diagnosis of RIGHT ACL MEDIAL MENISCUS TEAR  The various methods of treatment have been discussed with the patient and family. After consideration of risks, benefits and other options for treatment, the patient has consented to  Procedure(s): KNEE ARTHROSCOPY WITH ANTERIOR CRUCIATE LIGAMENT (ACL) REPAIR PARTIAL INTERIOR MEDIAL MENISCECTOMY (Right) as a surgical intervention .  The patient's history has been reviewed, patient examined, no change in status, stable for surgery.  I have reviewed the patient's chart and labs.  Questions were answered to the patient's satisfaction.     Nestor LewandowskyOWAN,Yidel Teuscher J

## 2014-11-23 NOTE — Op Note (Signed)
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 Turner Danielsowan M.D.  Assist: Tomi LikensEric K. Gaylene BrooksPhillips PA-C  (present throughout entire procedure and necessary for timely completion of the procedure) Anes: General LMA  EBL: Minimal  Fluids: 800 cc   Indications: Patient thinks she injured her right knee for years ago and has had some instability in the knee ever since. [place] by trainer but never really saw a physician. She presented to our office a few weeks ago because of the instability and on examination had a markedly positive Lachman's test. MRI scan was accomplished showing medial meniscal tear that was described as transverse in nature. Patient desires to participate in running and cutting sports and actually has done so for the last 6 years. Pt has failed conservative treatment with anti-inflammatory medicines, modified activites. Pain has recurred and patient desires elective arthroscopic evaluation and treatment of knee. Risks and benefits of surgery have been discussed and questions answered.  Procedure: Patient identified by arm band and taken to the operating room at the day surgery Center. The appropriate anesthetic monitors were attached, and General LMA anesthesia was induced without difficulty. Lateral post was applied to the table and the lower extremity was prepped and draped in usual sterile fashion from the ankle to the midthigh. Time out procedure was performed. We began the operation by making standard inferior lateral and inferior medial peripatellar portals with a #11 blade allowing introduction of the arthroscope through the inferior lateral portal and the out flow to the  inferior medial portal. Pump pressure was set at 100 mmHg and diagnostic arthroscopy  revealed a normal patellofemoral joint, normal lateral compartment, and the notch. On the medial side the patient had a large bucket-handle tear that was obviously old white on white. This was removed with straight biters and a 4.2 gray-white sucker shaver. At this point the knee was flexed to 45 in the stool Berg positioning device and a superolateral notchplasty was performed a 4.5 hooded vortex bur. At this point the arthroscope was removed from the knee and we harvested our graft. Anterior midline incision starting at the tip of the patella going 1 cm medial to and distal to the tibial tubercle was made through the skin and subcutaneous tissue to the transverse retinaculum over the patellar tendon this was section reflected medially and laterally the patellar tendon was measured at 32 mm and a central 10 mm bone tendon bone graft was then harvested with 9 mm x 22 mm patellar and tibial bone plugs the tibial bone plug was drilled and a 20-gauge steel wire was place the tibial bone plug the patellar bone plug was drilled and a #2 FiberWire placed through the patellar bone plug. Using the Linvatec tibial pin positioning guide set at 55 a guidepin was placed through the proximal medial flare of the tibia to the posterior aspect of the anterior cruciate ligament footprint and overreamed with a 9 mm badger reamer. Using this tunnel with the knee set at 55 a second guidepin was placed in the 10:30 position of the posterior lateral femoral condyle in the notch with a 7 mm over-the-top guide. This is overreamed with a 9 mm tumor depth of 35 mm and  then using both tunnels the Beath pin was placed through the tibial tunnel up to the femoral tunnel and exited the skin superior laterally on the femur. Using the Beath needle we then pulled our autograft which been prepared at the back table with 9 mm bone plugs through the tibial tunnel  using a #2 FiberWire and the patellar bone plug was positioned in the femoral tunnel without difficulty. The knee was taken through a range of motion checking isometry. With the knee flexed at 105 using an accessory medial parapatellar portal a guidepin was placed between the patellar bone plug and the femoral tunnel and the bone plug was fixed with an 8 x 20 mm Linvatec propel titanium screw protecting the graft with the grafts bone. The knee was then placed at 45 flexion with reverse Lachman's maneuver tension applied to the graft with the 20-gauge wire a second guidepin was placed in the tibial tunnel and the tibial bone plug fixed with another 8 x 20 mm Linvatec propel titanium screw. The knee was taken through range of motion checking graft tension. We then irrigated out normal saline solution. We closed in layers of 3-0 Vicryl suture in the subcutaneous and subcuticular tissue dressing of Xerofoam 4 x 4 dressing sponges web roll and an Ace wrap was applied patient was awakened extubated and taken to the recovery room.1  the anterior cruciate ligament was absent. The knee was irrigated out normal saline solution. A dressing of xerofoam 4 x 4 dressing sponges, web roll and an Ace wrap was applied. The patient was awakened extubated and taken to the recovery without difficulty.    Signed: Nestor Lewandowsky, MD

## 2014-11-24 ENCOUNTER — Encounter (HOSPITAL_BASED_OUTPATIENT_CLINIC_OR_DEPARTMENT_OTHER): Payer: Self-pay | Admitting: Orthopedic Surgery

## 2014-11-24 NOTE — Addendum Note (Signed)
Addendum  created 11/24/14 [id number] by Lance CoonWesley Hatsue Sime, CRNA   Modules edited: Charges VN

## 2014-12-08 ENCOUNTER — Ambulatory Visit: Payer: Medicaid Other | Attending: Orthopedic Surgery | Admitting: Physical Therapy

## 2014-12-08 DIAGNOSIS — M25561 Pain in right knee: Secondary | ICD-10-CM

## 2014-12-08 DIAGNOSIS — R269 Unspecified abnormalities of gait and mobility: Secondary | ICD-10-CM | POA: Diagnosis present

## 2014-12-08 DIAGNOSIS — R29898 Other symptoms and signs involving the musculoskeletal system: Secondary | ICD-10-CM | POA: Diagnosis present

## 2014-12-08 DIAGNOSIS — R600 Localized edema: Secondary | ICD-10-CM | POA: Insufficient documentation

## 2014-12-08 DIAGNOSIS — M25661 Stiffness of right knee, not elsewhere classified: Secondary | ICD-10-CM

## 2014-12-08 NOTE — Therapy (Signed)
[facility] [address] [place], [place], [address] Phone: [phone]   Fax:  [phone]  Physical Therapy Evaluation  Patient Details  Name: Amy Neal MRN: [id number] Date of Birth: 01/24/1995 Referring Provider: Gean BirchwoodFrank Rowan MD  Encounter Date: 12/08/2014      PT End of Session - 12/08/14 1257    Visit Number 1   Number of Visits 16   Date for PT Re-Evaluation 02/02/15   Authorization Type Medicaid   PT Start Time 1100   PT Stop Time 1145   PT Time Calculation (min) 45 min   Activity Tolerance Patient tolerated treatment well   Behavior During Therapy [facility] for tasks assessed/performed      Past Medical History  Diagnosis Date  . Right ACL tear 11/2014  . Medial meniscus tear 11/2014    right knee  . Dental crowns present     also dental caps    Past Surgical History  Procedure Laterality Date  . Wisdom tooth extraction    . Knee arthroscopy with anterior cruciate ligament (acl) repair Right 11/23/2014    Procedure: KNEE ARTHROSCOPY WITH ANTERIOR CRUCIATE LIGAMENT (ACL) REPAIR PARTIAL INTERIOR MEDIAL MENISCECTOMY;  Surgeon: Gean BirchwoodFrank Rowan, MD;  Location: [facility];  Service: Orthopedics;  Laterality: Right;  . Knee arthroscopy with medial menisectomy Left 11/23/2014    Procedure: KNEE ARTHROSCOPY WITH MEDIAL MENISECTOMY;  Surgeon: Gean BirchwoodFrank Rowan, MD;  Location: [facility];  Service: Orthopedics;  Laterality: Left;    There were no vitals filed for this visit.  Visit Diagnosis:  Right knee pain - Plan: PT plan of care cert/re-cert  Localized edema - Plan: PT plan of care cert/re-cert  Weakness of right leg - Plan: PT plan of care cert/re-cert  Decreased ROM of right knee - Plan: PT plan of care cert/re-cert  Abnormality of gait - Plan: PT plan of care cert/re-cert      Subjective Assessment - 12/08/14 1151    Subjective pt is a [age] y.o F s/p R ACL reconstruction on 11/23/2014  from a ACL tear that occured 6 years ago from playing basketball in high school. pt report some numbness on the inside of her knee.  since the surgery she reports stiffness and pain.    Limitations Standing;Walking;Lifting;House hold activities   How long can you sit comfortably? 10 min   How long can you stand comfortably? 7 min   How long can you walk comfortably? 7 min   Diagnostic tests MRI couple weeks before surgery   Patient Stated Goals to get back to normal,    Currently in Pain? Yes   Pain Score 6    Pain Location Knee   Pain Orientation Right   Pain Descriptors / Indicators Tender;Tingling  stifness   Pain Type Surgical pain   Pain Onset More than a month ago   Pain Frequency Intermittent   Aggravating Factors  prolonged standing, and bending the knee   Pain Relieving Factors pain medication, icing, elevation, ace wrap            [facility] PT Assessment - 12/08/14 1157    Assessment   Medical Diagnosis S/p R ACL reconstruction   Referring Provider Gean BirchwoodFrank Rowan MD   Onset Date/Surgical Date 11/23/14   Hand Dominance Right   Next MD Visit 12/15/2014   Prior Therapy no   Precautions   Precautions Knee   Restrictions   Weight Bearing Restrictions No   Balance Screen   Has  the patient fallen in the past 6 months No   Has the patient had a decrease in activity level because of a fear of falling?  No   Is the patient reluctant to leave their home because of a fear of falling?  No   Home Environment   Living Environment Private residence   Living Arrangements Non-relatives/Friends   Available Help at Discharge Available 24 hours/day;Available PRN/intermittently   Type of Home Apartment   Home Access Stairs to enter   Entrance Stairs-Number of Steps 2   Entrance Stairs-Rails None   Home Layout One level   Home Equipment Crutches   Prior Function   Level of Independence Independent;Independent with basic ADLs   Vocation Student;Part time employment  [employer], picking at  warehouse   Leisure sleeping, working   Cognition   Overall Cognitive Status Within Functional Limits for tasks assessed   Observation/Other Assessments   Focus on Therapeutic Outcomes (FOTO)  67% limited  predicted 43% limited   Observation/Other Assessments-Edema    Edema Circumferential   Circumferential Edema   Circumferential - Right --  39.6 cm @ joint line, 10cm above 45cm , 10cm below 36.2 cm   Posture/Postural Control   Posture/Postural Control Postural limitations   ROM / Strength   AROM / PROM / Strength AROM;PROM;Strength   AROM   AROM Assessment Site Knee   Right/Left Knee Right;Left   Right Knee Extension -20   Right Knee Flexion 99   Left Knee Extension 0   Left Knee Flexion 132   PROM   PROM Assessment Site Knee   Right/Left Knee Right;Left   Right Knee Extension -12   Right Knee Flexion 113   Ambulation/Gait   Gait Pattern Step-through pattern;Decreased stride length;Antalgic;Decreased stance time - right;Decreased step length - left;Decreased weight shift to right                           PT Education - 12/08/14 1256    Education provided Yes   Education Details evaluation findings, POC, Goals,HEP importance of using crutches   Person(s) Educated Patient   Methods Explanation   Comprehension Verbalized understanding          PT Short Term Goals - 12/08/14 1304    PT SHORT TERM GOAL #1   Title pt will be I with initial HEP (01/07/2014)   Baseline no previous HEP   Time 4   Period Weeks   Status New   PT SHORT TERM GOAL #2   Title pt will increase her R Knee to 0 - 105 with </= 4/10 pain to promote functional progression (01/07/2014)   Baseline -18 - 99   Time 4   Period Weeks   Status New   PT SHORT TERM GOAL #3   Title pt will be able to elicit a full quadricep contraction to work towards safety with weight bearing activities (01/07/2014)   Time 4   Period Weeks   Status New   PT SHORT TERM GOAL #4   Title pt will be able  to verbalize and demonstrate techniques to control R knee pain and inflammation via RICE method (01/07/2014)   Time 4   Period Weeks   Status New   PT SHORT TERM GOAL #5   Title pt will be able to walk/ standing for >/= 10 minutes with </= 4/10 pain to promote standing and walking endurance(01/07/2014)   Baseline pt above to stand for 7 minutes  Time 6   Period Weeks   Status New           PT Long Term Goals - 12/08/14 1309    PT LONG TERM GOAL #1   Title pt will be I with all HEP at discharge (02/02/2015)   Baseline no previous HEP   Time 8   Period Weeks   Status New   PT LONG TERM GOAL #2   Title pt will dmonstrate improved R knee AROM flexion to >/=120 and extension 0 with </=2/10 pain to assist with functional and efficient gait pattern (02/02/2015)   Baseline R knee flexion 99, extensoin -18   Time 8   Period Weeks   Status New   PT LONG TERM GOAL #3   Title pt will demonstrate R knee strength to >/=4/5 strength to promote safety with prolonged standing / walking activities associated with school requirements (02/02/2015)   Baseline unable to test strength    Time 8   Period Weeks   Status New   PT LONG TERM GOAL #4   Title pt will be able to walk/ stand for >/=20 minutes with </=2/10 pain to help with endurance with prolonged weight bearing activities ( 02/02/2015)   Baseline pt able to walk/ standing 7 minutes   Time 8   Period Weeks   Status New   PT LONG TERM GOAL #5   Title pt will improve her FOTO score to >/= 55 to demonstrate improved function at discharge (02/02/2015)   Baseline FOTO score 37   Time 8   Period Weeks   Status New               Plan - 12/08/14 1257    Clinical Impression Statement Nyimah reports to OPPT s/p R ACL reconstruction on 11/23/2014.  The incision site appears to be clean, intact and healing well. She demonstrates limited R knee AROM/PROM. MMT wasn't assessed today due to inability to activity quad muscles and due to only being  2 weeks out of surgery.  she demosntrates swellin in the R knee compared bil with tendneress peripatellar.  She arrived without using her crutches, educated about beneifts of using crutches for now. She exhibits an antalgic gait pattern with limited stance time on the R with decrased step length on the L. pt will  benefit from physical therapy to decreaed pain and return to PLOF by addressing the impairments listed.   CPT code 81191   Pt will benefit from skilled therapeutic intervention in order to improve on the following deficits Pain;Improper body mechanics;Postural dysfunction;Difficulty walking;Hypomobility;Decreased endurance;Increased fascial restricitons;Decreased strength;Increased edema;Decreased activity tolerance;Decreased balance;Decreased mobility   Rehab Potential Good   PT Frequency 2x / week   PT Duration 8 weeks   PT Treatment/Interventions ADLs/Self Care Home Management;Cryotherapy;Electrical Stimulation;Iontophoresis 4mg /ml Dexamethasone;Moist Heat;Therapeutic exercise;Therapeutic activities;Taping;Dry needling;Vasopneumatic Device;Passive range of motion;Patient/family education;Ultrasound;Balance training;Gait training;Manual techniques   PT Next Visit Plan assess response to HEP, re-educated about crutches (if she isn't using them), quad activation, glute strengthening,    PT Home Exercise Plan quad set, SLR, heel slides,  calf raises   Consulted and Agree with Plan of Care Patient         Problem List There are no active problems to display for this patient.  Lulu Riding PT, DPT, LAT, ATC  12/08/2014  1:19 PM    [facility] [address] [place], [place], [address] Phone: [phone]   Fax:  [phone]  Name: Amy Neal  MRN: [id number] Date of Birth: 11-02-1995

## 2014-12-08 NOTE — Patient Instructions (Signed)
   Ruvim Risko PT, DPT, LAT, ATC  [facility] Phone: [phone]     

## 2014-12-10 ENCOUNTER — Ambulatory Visit: Payer: Medicaid Other | Admitting: Physical Therapy

## 2014-12-22 ENCOUNTER — Ambulatory Visit: Payer: Medicaid Other | Admitting: Physical Therapy

## 2014-12-22 DIAGNOSIS — M25561 Pain in right knee: Secondary | ICD-10-CM | POA: Diagnosis not present

## 2014-12-22 DIAGNOSIS — R269 Unspecified abnormalities of gait and mobility: Secondary | ICD-10-CM

## 2014-12-22 DIAGNOSIS — M25661 Stiffness of right knee, not elsewhere classified: Secondary | ICD-10-CM

## 2014-12-22 DIAGNOSIS — R600 Localized edema: Secondary | ICD-10-CM

## 2014-12-22 DIAGNOSIS — R29898 Other symptoms and signs involving the musculoskeletal system: Secondary | ICD-10-CM

## 2014-12-22 NOTE — Therapy (Signed)
[place], [place], [address] Phone: [phone]   Fax:  [phone]  Physical Therapy Treatment  Patient Details  Name: Amy Neal MRN: [id number] Date of Birth: [birth year] Referring Provider: Frederik Pear MD  Encounter Date: 12/22/2014      PT End of Session - 12/22/14 1210    Visit Number 2   Number of Visits 16   Date for PT Re-Evaluation 02/02/15   PT Start Time 1150   Activity Tolerance Patient tolerated treatment well   Behavior During Therapy [facility] for tasks assessed/performed      Past Medical History  Diagnosis Date  . Right ACL tear 11/2014  . Medial meniscus tear 11/2014    right knee  . Dental crowns present     also dental caps    Past Surgical History  Procedure Laterality Date  . Wisdom tooth extraction    . Knee arthroscopy with anterior cruciate ligament (acl) repair Right 11/23/2014    Procedure: KNEE ARTHROSCOPY WITH ANTERIOR CRUCIATE LIGAMENT (ACL) REPAIR PARTIAL INTERIOR MEDIAL MENISCECTOMY;  Surgeon: Frederik Pear, MD;  Location: [place];  Service: Orthopedics;  Laterality: Right;  . Knee arthroscopy with medial menisectomy Left 11/23/2014    Procedure: KNEE ARTHROSCOPY WITH MEDIAL MENISECTOMY;  Surgeon: Frederik Pear, MD;  Location: [place];  Service: Orthopedics;  Laterality: Left;    There were no vitals filed for this visit.  Visit Diagnosis:  Right knee pain  Localized edema  Weakness of right leg  Decreased ROM of right knee  Abnormality of gait      Subjective Assessment - 12/22/14 1154    Subjective She brought crutches today.  Walks in without weightbearing on Rt. LE.   Currently in Pain? No/denies  incr to 7/10 with quad ex   Pain Score 7    Pain Location Knee   Pain Orientation Right   Pain Descriptors / Indicators Sharp   Pain Type Surgical pain   Pain Onset More than a month ago   Pain Frequency Intermittent                          [facility] Adult PT Treatment/Exercise - 12/22/14 1159    Ambulation/Gait   Ambulation/Gait Yes   Ambulation Distance (Feet) 300 Feet   Assistive device Crutches  for only short period, pt wanted to try without    Gait Pattern Step-through pattern;Decreased stance time - right   Ambulation Surface Level;Indoor   Gait Comments worked on correcting limp and landing on heel, rolling through entire foot    Knee/Hip Exercises: Aerobic   Nustep LE only 7 min level 3 for strengthening   Knee/Hip Exercises: Standing   Heel Raises Both;1 set;20 reps   Knee/Hip Exercises: Supine   Quad Sets Strengthening;Right;1 set;20 reps   Short Arc Quad Sets Strengthening;Right;1 set;20 reps   Heel Slides AAROM;Right;1 set   Heel Slides Limitations seated with towel 100 deg, PROM to 110 in supine    Straight Leg Raises Strengthening;Right;1 set;10 reps  10 deg quad lag   Straight Leg Raises Limitations able to do when propped on elbows    Straight Leg Raise with External Rotation Strengthening;Right;1 set;10 reps   Patellar Mobs gentle    Knee Extension Limitations lacks 8 deg    Other Supine Knee/Hip Exercises supine knee flex/ext AROM    Other Supine Knee/Hip Exercises LAQ x 20    Modalities  Modalities Cryotherapy   Cryotherapy   Number Minutes Cryotherapy 10 Minutes   Cryotherapy Location Knee   Type of Cryotherapy Ice pack                  PT Short Term Goals - 12/22/14 1220    PT SHORT TERM GOAL #1   Title pt will be I with initial HEP (01/07/2014)   Status Partially Met   PT SHORT TERM GOAL #2   Title pt will increase her R Knee to 0 - 105 with </= 4/10 pain to promote functional progression (01/07/2014)   Status On-going   PT SHORT TERM GOAL #3   Title pt will be able to elicit a full quadricep contraction to work towards safety with weight bearing activities (01/07/2014)   Status On-going   PT SHORT TERM GOAL #4   Title pt will be able to  verbalize and demonstrate techniques to control R knee pain and inflammation via RICE method (01/07/2014)   Status Achieved   PT SHORT TERM GOAL #5   Title pt will be able to walk/ standing for >/= 10 minutes with </= 4/10 pain to promote standing and walking endurance(01/07/2014)   Status On-going           PT Long Term Goals - 12/22/14 1221    PT LONG TERM GOAL #1   Title pt will be I with all HEP at discharge (02/02/2015)   Status On-going   PT LONG TERM GOAL #2   Title pt will dmonstrate improved R knee AROM flexion to >/=120 and extension 0 with </=2/10 pain to assist with functional and efficient gait pattern (02/02/2015)   Status On-going   PT LONG TERM GOAL #3   Title pt will demonstrate R knee strength to >/=4/5 strength to promote safety with prolonged standing / walking activities associated with school requirements (02/02/2015)   Status On-going   PT LONG TERM GOAL #4   Title pt will be able to walk/ stand for >/=20 minutes with </=2/10 pain to help with endurance with prolonged weight bearing activities ( 02/02/2015)   Status On-going   PT LONG TERM GOAL #5   Title pt will improve her FOTO score to >/= 55 to demonstrate improved function at discharge (02/02/2015)   Status On-going               Plan - 12/22/14 1218    Clinical Impression Statement Forrestine tolerated ex well, able to demo increased AAROM minimally but with pain. Worked on gait with and without crutches, able to do so with a limp.  She was pleased with her progress.  Swelling only min today.    PT Next Visit Plan cont with quad strength, glute stregth and advance HEP /ROM    PT Home Exercise Plan quad set, SLR, heel slides,  calf raises   Consulted and Agree with Plan of Care Patient        Problem List There are no active problems to display for this patient.   Amy Neal 12/22/2014, 12:29 PM  [facility] [address] [place], [place],  [address] Phone: [phone]   Fax:  [phone]  Name: Amy Neal MRN: [id number] Date of Birth: Jul 27, 1995    Raeford Razor, PT 12/22/2014 12:30 PM Phone: [phone] Fax: [phone]

## 2014-12-24 ENCOUNTER — Ambulatory Visit: Payer: Medicaid Other | Admitting: Physical Therapy

## 2014-12-24 DIAGNOSIS — M25661 Stiffness of right knee, not elsewhere classified: Secondary | ICD-10-CM

## 2014-12-24 DIAGNOSIS — R269 Unspecified abnormalities of gait and mobility: Secondary | ICD-10-CM

## 2014-12-24 DIAGNOSIS — R29898 Other symptoms and signs involving the musculoskeletal system: Secondary | ICD-10-CM

## 2014-12-24 DIAGNOSIS — M25561 Pain in right knee: Secondary | ICD-10-CM

## 2014-12-24 NOTE — Therapy (Signed)
[facility] [address] [place], [place], [address] Phone: [phone]   Fax:  [phone]  Physical Therapy Treatment  Patient Details  Name: Amy Neal MRN: [id number] Date of Birth: [birth year] Referring Provider: Gean Birchwood MD  Encounter Date: 12/24/2014      PT End of Session - 12/24/14 1107    Visit Number 3   Number of Visits 16   Date for PT Re-Evaluation 02/02/15   Authorization Type Medicaid   PT Start Time 1000   PT Stop Time 1056   PT Time Calculation (min) 56 min   Activity Tolerance Patient tolerated treatment well;No increased pain   Behavior During Therapy [facility] for tasks assessed/performed      Past Medical History  Diagnosis Date  . Right ACL tear 11/2014  . Medial meniscus tear 11/2014    right knee  . Dental crowns present     also dental caps    Past Surgical History  Procedure Laterality Date  . Wisdom tooth extraction    . Knee arthroscopy with anterior cruciate ligament (acl) repair Right 11/23/2014    Procedure: KNEE ARTHROSCOPY WITH ANTERIOR CRUCIATE LIGAMENT (ACL) REPAIR PARTIAL INTERIOR MEDIAL MENISCECTOMY;  Surgeon: Gean Birchwood, MD;  Location: [facility];  Service: Orthopedics;  Laterality: Right;  . Knee arthroscopy with medial menisectomy Left 11/23/2014    Procedure: KNEE ARTHROSCOPY WITH MEDIAL MENISECTOMY;  Surgeon: Gean Birchwood, MD;  Location: [facility];  Service: Orthopedics;  Laterality: Left;    There were no vitals filed for this visit.  Visit Diagnosis:  Right knee pain  Weakness of right leg  Decreased ROM of right knee  Abnormality of gait      Subjective Assessment - 12/24/14 1000    Subjective The knee seems like it isn't going as good as it should. Still a little numb and tender.    Currently in Pain? No/denies   Aggravating Factors  heat, putting weight on it for too long.    Pain Relieving Factors ice, stay off it.                          [facility] Adult PT Treatment/Exercise - 12/24/14 0001    Ambulation/Gait   Ambulation/Gait Yes   Ambulation Distance (Feet) 250 Feet   Assistive device None   Gait Pattern Step-through pattern;Decreased stance time - right   Ambulation Surface Level   Gait velocity decreased   Gait Comments working on heel/toe, quad activation at midstance, and weight shift to right. No loss of balance or buckling noted. Patient to ambulate in home if safe without crutch assist.    Knee/Hip Exercises: Stretches   Other Knee/Hip Stretches knee extension stretch on bolster, gentle overpressure   Knee/Hip Exercises: Aerobic   Nustep L4 X7 minutes (LE only)   Knee/Hip Exercises: Standing   Terminal Knee Extension Limitations Green band above knee - 1X15 with weight shift to rt.    Other Standing Knee Exercises sit/stand with even weightbearing    Knee/Hip Exercises: Supine   Quad Sets Strengthening;Right;1 set;10 reps   Straight Leg Raises Strengthening;Right;1 set;10 reps   Straight Leg Raises Limitations mild lag, cues for max quad contraction   Manual Therapy   Manual therapy comments scar and patellar mobilization- education on gentle patellar mobs.                 PT Education - 12/24/14 1106    Education  provided Yes   Education Details Gait, avoiding compensatory use of arms with moving Rt LE, HEP, patellar mobilization   Person(s) Educated Patient   Methods Explanation;Demonstration;Tactile cues;Verbal cues;Handout   Comprehension Verbalized understanding          PT Short Term Goals - 12/22/14 1220    PT SHORT TERM GOAL #1   Title pt will be I with initial HEP (01/07/2014)   Status Partially Met   PT SHORT TERM GOAL #2   Title pt will increase her R Knee to 0 - 105 with </= 4/10 pain to promote functional progression (01/07/2014)   Status On-going   PT SHORT TERM GOAL #3   Title pt will be able to elicit a full quadricep contraction to work  towards safety with weight bearing activities (01/07/2014)   Status On-going   PT SHORT TERM GOAL #4   Title pt will be able to verbalize and demonstrate techniques to control R knee pain and inflammation via RICE method (01/07/2014)   Status Achieved   PT SHORT TERM GOAL #5   Title pt will be able to walk/ standing for >/= 10 minutes with </= 4/10 pain to promote standing and walking endurance(01/07/2014)   Status On-going           PT Long Term Goals - 12/22/14 1221    PT LONG TERM GOAL #1   Title pt will be I with all HEP at discharge (02/02/2015)   Status On-going   PT LONG TERM GOAL #2   Title pt will dmonstrate improved R knee AROM flexion to >/=120 and extension 0 with </=2/10 pain to assist with functional and efficient gait pattern (02/02/2015)   Status On-going   PT LONG TERM GOAL #3   Title pt will demonstrate R knee strength to >/=4/5 strength to promote safety with prolonged standing / walking activities associated with school requirements (02/02/2015)   Status On-going   PT LONG TERM GOAL #4   Title pt will be able to walk/ stand for >/=20 minutes with </=2/10 pain to help with endurance with prolonged weight bearing activities ( 02/02/2015)   Status On-going   PT LONG TERM GOAL #5   Title pt will improve her FOTO score to >/= 55 to demonstrate improved function at discharge (02/02/2015)   Status On-going               Plan - 12/24/14 1107    Clinical Impression Statement Patient needing cues and encouragment to avoid compensations when using Rt LE with functional tasks such as sit/stand, gait, and mat transfers. Difficulty with maintaining knee extension with functional activities such as gait but improved by end of session. Time spent on gait to decreased compensations. Will continue ot progress ROM, strength and functional mobility as tolerated.    PT Next Visit Plan Gait training with weaning from crutches, reinforce sit/stand with increased use of Rt LE, knee  extension stretch and with gait.    PT Home Exercise Plan check terminal knee extension, calf raises, heel slides, SLR without lag   Consulted and Agree with Plan of Care Patient        Problem List There are no active problems to display for this patient.   Linard Millers, PT 12/24/2014, 11:21 AM  [facility] [address] [place], [place], [address] Phone: [phone]   Fax:  [phone]  Name: Amy Neal MRN: [id number] Date of Birth: 1995-07-22

## 2014-12-29 ENCOUNTER — Ambulatory Visit: Payer: Medicaid Other | Admitting: Physical Therapy

## 2014-12-29 DIAGNOSIS — M25661 Stiffness of right knee, not elsewhere classified: Secondary | ICD-10-CM

## 2014-12-29 DIAGNOSIS — R29898 Other symptoms and signs involving the musculoskeletal system: Secondary | ICD-10-CM

## 2014-12-29 DIAGNOSIS — M25561 Pain in right knee: Secondary | ICD-10-CM | POA: Diagnosis not present

## 2014-12-29 DIAGNOSIS — R600 Localized edema: Secondary | ICD-10-CM

## 2014-12-29 DIAGNOSIS — R269 Unspecified abnormalities of gait and mobility: Secondary | ICD-10-CM

## 2014-12-29 NOTE — Therapy (Signed)
[place], [place], [address] Phone: [phone]   Fax:  [phone]  Physical Therapy Treatment  Patient Details  Name: Amy Neal MRN: [id number] Date of Birth: [birth year] Referring Provider: Frederik Pear MD  Encounter Date: 12/29/2014      PT End of Session - 12/29/14 1026    Visit Number 4   Number of Visits 16   Date for PT Re-Evaluation 02/02/15   Authorization Type Medicaid   PT Start Time 0932   PT Stop Time 1018   PT Time Calculation (min) 46 min   Activity Tolerance Patient tolerated treatment well   Behavior During Therapy [facility] for tasks assessed/performed      Past Medical History  Diagnosis Date  . Right ACL tear 11/2014  . Medial meniscus tear 11/2014    right knee  . Dental crowns present     also dental caps    Past Surgical History  Procedure Laterality Date  . Wisdom tooth extraction    . Knee arthroscopy with anterior cruciate ligament (acl) repair Right 11/23/2014    Procedure: KNEE ARTHROSCOPY WITH ANTERIOR CRUCIATE LIGAMENT (ACL) REPAIR PARTIAL INTERIOR MEDIAL MENISCECTOMY;  Surgeon: Frederik Pear, MD;  Location: [place];  Service: Orthopedics;  Laterality: Right;  . Knee arthroscopy with medial menisectomy Left 11/23/2014    Procedure: KNEE ARTHROSCOPY WITH MEDIAL MENISECTOMY;  Surgeon: Frederik Pear, MD;  Location: [place];  Service: Orthopedics;  Laterality: Left;    There were no vitals filed for this visit.  Visit Diagnosis:  Right knee pain  Weakness of right leg  Decreased ROM of right knee  Abnormality of gait  Localized edema      Subjective Assessment - 12/29/14 0935    Subjective Doing a little better, walking better for sure. Can be up for about 15 minutes yet.    How long can you walk comfortably? 15 minutes   Currently in Pain? No/denies                         [facility] Adult PT  Treatment/Exercise - 12/29/14 0001    Knee/Hip Exercises: Stretches   Other Knee/Hip Stretches knee extension stretch on bolster, gentle overpressure   Knee/Hip Exercises: Aerobic   Nustep L4 X7 minutes (LE only)   Knee/Hip Exercises: Standing   Other Standing Knee Exercises mini squats with weight shift 1X10   Knee/Hip Exercises: Supine   Quad Sets Strengthening;Right;1 set;10 reps   Heel Slides AAROM;Right;1 set;10 reps   Heel Slides Limitations 111 degrees   Knee/Hip Exercises: Sidelying   Hip ABduction Strengthening;Right;1 set;10 reps   Hip ADduction Strengthening;Right;1 set;10 reps                PT Education - 12/29/14 1025    Education provided Yes   Education Details HEP progression, beginning squat technique   Person(s) Educated Patient   Methods Explanation;Demonstration;Tactile cues;Verbal cues;Handout   Comprehension Verbalized understanding          PT Short Term Goals - 12/22/14 1220    PT SHORT TERM GOAL #1   Title pt will be I with initial HEP (01/07/2014)   Status Partially Met   PT SHORT TERM GOAL #2   Title pt will increase her R Knee to 0 - 105 with </= 4/10 pain to promote functional progression (01/07/2014)   Status On-going   PT SHORT TERM GOAL #3   Title pt  will be able to elicit a full quadricep contraction to work towards safety with weight bearing activities (01/07/2014)   Status On-going   PT SHORT TERM GOAL #4   Title pt will be able to verbalize and demonstrate techniques to control R knee pain and inflammation via RICE method (01/07/2014)   Status Achieved   PT SHORT TERM GOAL #5   Title pt will be able to walk/ standing for >/= 10 minutes with </= 4/10 pain to promote standing and walking endurance(01/07/2014)   Status On-going           PT Long Term Goals - 12/22/14 1221    PT LONG TERM GOAL #1   Title pt will be I with all HEP at discharge (02/02/2015)   Status On-going   PT LONG TERM GOAL #2   Title pt will dmonstrate improved R  knee AROM flexion to >/=120 and extension 0 with </=2/10 pain to assist with functional and efficient gait pattern (02/02/2015)   Status On-going   PT LONG TERM GOAL #3   Title pt will demonstrate R knee strength to >/=4/5 strength to promote safety with prolonged standing / walking activities associated with school requirements (02/02/2015)   Status On-going   PT LONG TERM GOAL #4   Title pt will be able to walk/ stand for >/=20 minutes with </=2/10 pain to help with endurance with prolonged weight bearing activities ( 02/02/2015)   Status On-going   PT LONG TERM GOAL #5   Title pt will improve her FOTO score to >/= 55 to demonstrate improved function at discharge (02/02/2015)   Status On-going               Plan - 12/29/14 1027    Clinical Impression Statement Reports doing much better, walking better and knee is going straight now. Patient noted to demonstrate significant improvement in gait, knee extension and strength. Improving quad activation noted. Able to progress to mini squat exercise but heavy cues needed for correct technique. Will continue to progress strength and motion and tolerated.    PT Next Visit Plan Check mini squats and progress as tolerated. Continue with hip strengthening as well. Check gait training.    PT Home Exercise Plan minisquats technique.    Consulted and Agree with Plan of Care Patient        Problem List There are no active problems to display for this patient.   Cassell Clement, PT, CSCS Pager [phone]  12/29/2014, 10:31 AM  [place], [place], [address] Phone: [phone]   Fax:  [phone]  Name: Amy Neal MRN: [id number] Date of Birth: [birth year]

## 2014-12-31 ENCOUNTER — Ambulatory Visit: Payer: Medicaid Other | Admitting: Physical Therapy

## 2014-12-31 DIAGNOSIS — M25561 Pain in right knee: Secondary | ICD-10-CM

## 2014-12-31 DIAGNOSIS — R29898 Other symptoms and signs involving the musculoskeletal system: Secondary | ICD-10-CM

## 2014-12-31 DIAGNOSIS — R600 Localized edema: Secondary | ICD-10-CM

## 2014-12-31 DIAGNOSIS — M25661 Stiffness of right knee, not elsewhere classified: Secondary | ICD-10-CM

## 2014-12-31 DIAGNOSIS — R269 Unspecified abnormalities of gait and mobility: Secondary | ICD-10-CM

## 2014-12-31 NOTE — Patient Instructions (Signed)
Hamstring Stretch    With other leg bent, foot flat, grasp right leg and slowly try to straighten knee. Hold _30___ seconds. Repeat __3__ times. Do __2__ sessions per day.  http://gt2.exer.us/280   Copyright  VHI. All rights reserved.  Calf Stretch    Stand with hands supported on wall, elbows slightly bent, front knee bent, back knee straight, feet parallel and both heels on floor. Lean into wall by pushing hips forward until a stretch is felt in calf muscle. Hold __30__ seconds.   Copyright  VHI. All rights reserved.  Outer Hip Stretch: Reclined IT Band Stretch (Strap)    Strap around opposite foot, pull across only as far as possible with shoulders on mat. Hold for 30 sec. Repeat __3__ times right leg Forward  4 inch step and left hand rail Facing step, place one leg on step, flexed at hip. Step up slowly, bringing hips in line with knee and shoulder. Bring other foot onto step. Reverse process to step back down.  Do __10__ repetitions, ___2_ sets.  http://bt.exer.us/154   Copyright  VHI. All rights reserved.  Leg Flexion    Inhale. While exhaling, lift one ankle toward buttocks, keeping knees together. Slowly return to starting position. Repeat ___10_ times right leg. Do _2___ sets per session. Do __2__ sessions per day.

## 2014-12-31 NOTE — Therapy (Signed)
[place], [place], [address] Phone: [phone]   Fax:  [phone]  Physical Therapy Treatment  Patient Details  Name: Amy Neal MRN: [id number] Date of Birth: 06-02-1995 Referring Provider: Frederik Pear MD  Encounter Date: 12/31/2014      PT End of Session - 12/31/14 1022    Visit Number 5   Number of Visits 16   Date for PT Re-Evaluation 02/02/15   Authorization Type Medicaid   PT Start Time 1018   PT Stop Time 1115   PT Time Calculation (min) 57 min      Past Medical History  Diagnosis Date  . Right ACL tear 11/2014  . Medial meniscus tear 11/2014    right knee  . Dental crowns present     also dental caps    Past Surgical History  Procedure Laterality Date  . Wisdom tooth extraction    . Knee arthroscopy with anterior cruciate ligament (acl) repair Right 11/23/2014    Procedure: KNEE ARTHROSCOPY WITH ANTERIOR CRUCIATE LIGAMENT (ACL) REPAIR PARTIAL INTERIOR MEDIAL MENISCECTOMY;  Surgeon: Frederik Pear, MD;  Location: [place];  Service: Orthopedics;  Laterality: Right;  . Knee arthroscopy with medial menisectomy Left 11/23/2014    Procedure: KNEE ARTHROSCOPY WITH MEDIAL MENISECTOMY;  Surgeon: Frederik Pear, MD;  Location: [place];  Service: Orthopedics;  Laterality: Left;    There were no vitals filed for this visit.  Visit Diagnosis:  Right knee pain  Weakness of right leg  Decreased Neal of right knee  Abnormality of gait  Localized edema          [facility] PT Assessment - 12/31/14 1028    AROM   Right Knee Extension -8   Right Knee Flexion 108   PROM   Right Knee Extension 0                     [facility] Adult PT Treatment/Exercise - 12/31/14 1023    Knee/Hip Exercises: Stretches   Active Hamstring Stretch 3 reps;30 seconds   Gastroc Stretch 3 reps;30 seconds   Gastroc Stretch Limitations runner stretch at wall   Other  Knee/Hip Stretches ITB stretch cross body supine x 30 sec   Knee/Hip Exercises: Aerobic   Nustep L4 X7 minutes (LE only)   Knee/Hip Exercises: Standing   Heel Raises Both;1 set;20 reps   Knee Flexion Right;15 reps   Forward Step Up 1 set;10 reps;Hand Hold: 1;Step Height: 4"   Forward Step Up Limitations cues to decrease compensations   Other Standing Knee Exercises mini squats x 15- goo form today   Knee/Hip Exercises: Supine   Quad Sets Strengthening;Right;1 set;10 reps   Short Arc Quad Sets Strengthening;Right;1 set;10 reps   Straight Leg Raises Strengthening;Right;1 set;10 reps   Straight Leg Raises Limitations mild lag, cues for max quad contraction   Modalities   Modalities Vasopneumatic   Vasopneumatic   Number Minutes Vasopneumatic  15 minutes   Vasopnuematic Location  Knee   Vasopneumatic Pressure Medium   Vasopneumatic Temperature  32                PT Education - 12/31/14 1111    Education provided Yes   Education Details Hamsting, calf and ITB stretch, 4 inch step up, hamstring curl standing   Person(s) Educated Patient   Methods Explanation;Handout   Comprehension Verbalized understanding          PT Short Term Goals -  12/31/14 1133    PT SHORT TERM GOAL #1   Title pt will be I with initial HEP (01/07/2014)   Time 4   Period Weeks   Status Achieved   PT SHORT TERM GOAL #2   Title pt will increase her R Knee to 0 - 105 with </= 4/10 pain to promote functional progression (01/07/2014)   Baseline 8-108 AROM   Time 4   Period Weeks   Status Partially Met   PT SHORT TERM GOAL #3   Title pt will be able to elicit a full quadricep contraction to work towards safety with weight bearing activities (01/07/2014)   Time 4   Period Weeks   Status On-going   PT SHORT TERM GOAL #4   Title pt will be able to verbalize and demonstrate techniques to control R knee pain and inflammation via RICE method (01/07/2014)   Time 4   Period Weeks   Status Achieved   PT SHORT  TERM GOAL #5   Title pt will be able to walk/ standing for >/= 10 minutes with </= 4/10 pain to promote standing and walking endurance(01/07/2014)   Time 6   Period Weeks   Status Achieved           PT Long Term Goals - 12/22/14 1221    PT LONG TERM GOAL #1   Title pt will be I with all HEP at discharge (02/02/2015)   Status On-going   PT LONG TERM GOAL #2   Title pt will dmonstrate improved R knee AROM flexion to >/=120 and extension 0 with </=2/10 pain to assist with functional and efficient gait pattern (02/02/2015)   Status On-going   PT LONG TERM GOAL #3   Title pt will demonstrate R knee strength to >/=4/5 strength to promote safety with prolonged standing / walking activities associated with school requirements (02/02/2015)   Status On-going   PT LONG TERM GOAL #4   Title pt will be able to walk/ stand for >/=20 minutes with </=2/10 pain to help with endurance with prolonged weight bearing activities ( 02/02/2015)   Status On-going   PT LONG TERM GOAL #5   Title pt will improve her FOTO score to >/= 55 to demonstrate improved function at discharge (02/02/2015)   Status On-going               Plan - 12/31/14 1123    Clinical Impression Statement Pt demonstes improved Neal and decreased gait deviations. She is lacking 8 degrees of active knee extension. Passively she is zero however is causes pain and pulling in posterior knee. Instructed pt in hamstring, calf and ITB stretch per protocol and added to HEP. Also added standing hamstring curls and 4 inch step up to HEP per protocol and asked pt to perform 2 times per day and to stop any exercise that becomes painful. Trial of single crutch with good mechanics and no increased pain.    PT Next Visit Plan review HEP, check single crutch gait, progress as tolerated, add hip strength to hep.         Problem List There are no active problems to display for this patient.   Dorene Ar, [place] 12/31/2014, 11:46 AM  [facility] [address] [place], [place], [address] Phone: [phone]   Fax:  [phone]  Name: Amy Neal MRN: [id number] Date of Birth: Mar 02, 1995

## 2015-01-05 ENCOUNTER — Ambulatory Visit: Payer: Medicaid Other | Attending: Orthopedic Surgery | Admitting: Physical Therapy

## 2015-01-05 DIAGNOSIS — R600 Localized edema: Secondary | ICD-10-CM | POA: Insufficient documentation

## 2015-01-05 DIAGNOSIS — R269 Unspecified abnormalities of gait and mobility: Secondary | ICD-10-CM | POA: Insufficient documentation

## 2015-01-05 DIAGNOSIS — M25561 Pain in right knee: Secondary | ICD-10-CM | POA: Insufficient documentation

## 2015-01-05 DIAGNOSIS — R29898 Other symptoms and signs involving the musculoskeletal system: Secondary | ICD-10-CM | POA: Insufficient documentation

## 2015-01-07 ENCOUNTER — Ambulatory Visit: Payer: Medicaid Other | Admitting: Physical Therapy

## 2015-01-07 DIAGNOSIS — R600 Localized edema: Secondary | ICD-10-CM

## 2015-01-07 DIAGNOSIS — M25661 Stiffness of right knee, not elsewhere classified: Secondary | ICD-10-CM

## 2015-01-07 DIAGNOSIS — R269 Unspecified abnormalities of gait and mobility: Secondary | ICD-10-CM

## 2015-01-07 DIAGNOSIS — M25561 Pain in right knee: Secondary | ICD-10-CM | POA: Diagnosis not present

## 2015-01-07 DIAGNOSIS — R29898 Other symptoms and signs involving the musculoskeletal system: Secondary | ICD-10-CM

## 2015-01-07 NOTE — Therapy (Signed)
[place], [place], [address] Phone: [phone]   Fax:  [phone]  Physical Therapy Treatment  Patient Details  Name: Amy Neal MRN: [id number] Date of Birth: 28-Aug-1995 Referring Provider: Frederik Pear MD  Encounter Date: 01/07/2015      PT End of Session - 01/07/15 1017    Visit Number 6   Number of Visits 16   Date for PT Re-Evaluation 02/02/15   PT Start Time 0936   PT Stop Time 8101   PT Time Calculation (min) 59 min   Activity Tolerance Patient tolerated treatment well;No increased pain   Behavior During Therapy [facility] for tasks assessed/performed      Past Medical History  Diagnosis Date  . Right ACL tear 11/2014  . Medial meniscus tear 11/2014    right knee  . Dental crowns present     also dental caps    Past Surgical History  Procedure Laterality Date  . Wisdom tooth extraction    . Knee arthroscopy with anterior cruciate ligament (acl) repair Right 11/23/2014    Procedure: KNEE ARTHROSCOPY WITH ANTERIOR CRUCIATE LIGAMENT (ACL) REPAIR PARTIAL INTERIOR MEDIAL MENISCECTOMY;  Surgeon: Frederik Pear, MD;  Location: [place];  Service: Orthopedics;  Laterality: Right;  . Knee arthroscopy with medial menisectomy Left 11/23/2014    Procedure: KNEE ARTHROSCOPY WITH MEDIAL MENISECTOMY;  Surgeon: Frederik Pear, MD;  Location: [place];  Service: Orthopedics;  Laterality: Left;    There were no vitals filed for this visit.  Visit Diagnosis:  Right knee pain  Weakness of right leg  Decreased ROM of right knee  Abnormality of gait  Localized edema      Subjective Assessment - 01/07/15 0949    Subjective Gets stiff in the cold.  Pain doing a little better,  Seems to be swelling a little more with    Currently in Pain? No/denies   Pain Score 3    Pain Location Knee   Pain Orientation Right   Pain Descriptors / Indicators --  stiff   Aggravating  Factors  cold weather   Pain Relieving Factors ice                         [facility] Adult PT Treatment/Exercise - 01/07/15 0001    High Level Balance   High Level Balance Comments static/dynamic narrowed bas. Contact guard 1 X SBA for safety.    Knee/Hip Exercises: Stretches   Passive Hamstring Stretch 3 reps;30 seconds   Gastroc Stretch 3 reps;30 seconds   Knee/Hip Exercises: Machines for Strengthening   Cybex Leg Press 0 to 60 1 plate 10 X 3, both   Other Machine Life step L1 2 minutes, 5 floors.   Knee/Hip Exercises: Standing   Lunge Walking - Round Trips Static knee on counter 10 X each knee forward, back   SLS with Vectors Stand RT   Other Standing Knee Exercises minisquat   Vasopneumatic   Number Minutes Vasopneumatic  15 minutes   Vasopnuematic Location  Knee   Vasopneumatic Pressure Medium   Vasopneumatic Temperature  32                  PT Short Term Goals - 01/07/15 1316    PT SHORT TERM GOAL #1   Title pt will be I with initial HEP (01/07/2014)   Time 4   Period Weeks   Status Achieved   PT SHORT TERM  GOAL #2   Title pt will increase her R Knee to 0 - 105 with </= 4/10 pain to promote functional progression (01/07/2014)   Baseline 120 -2   Time 4   Period Weeks   Status Partially Met   PT SHORT TERM GOAL #3   Title pt will be able to elicit a full quadricep contraction to work towards safety with weight bearing activities (01/07/2014)   Time 4   Period Weeks   Status On-going   PT SHORT TERM GOAL #4   Title pt will be able to verbalize and demonstrate techniques to control R knee pain and inflammation via RICE method (01/07/2014)   Time 4   Period Weeks   Status Achieved   PT SHORT TERM GOAL #5   Title pt will be able to walk/ standing for >/= 10 minutes with </= 4/10 pain to promote standing and walking endurance(01/07/2014)   Time 6   Period Weeks   Status Achieved           PT Long Term Goals - 12/22/14 1221    PT LONG TERM GOAL  #1   Title pt will be I with all HEP at discharge (02/02/2015)   Status On-going   PT LONG TERM GOAL #2   Title pt will dmonstrate improved R knee AROM flexion to >/=120 and extension 0 with </=2/10 pain to assist with functional and efficient gait pattern (02/02/2015)   Status On-going   PT LONG TERM GOAL #3   Title pt will demonstrate R knee strength to >/=4/5 strength to promote safety with prolonged standing / walking activities associated with school requirements (02/02/2015)   Status On-going   PT LONG TERM GOAL #4   Title pt will be able to walk/ stand for >/=20 minutes with </=2/10 pain to help with endurance with prolonged weight bearing activities ( 02/02/2015)   Status On-going   PT LONG TERM GOAL #5   Title pt will improve her FOTO score to >/= 55 to demonstrate improved function at discharge (02/02/2015)   Status On-going               Plan - 01/07/15 1313    Clinical Impression Statement Progressing wih both ROM and strength.  Able to progress to more closed chain exercises as allowed by protocol.     PT Next Visit Plan continue protocol, add lunges to home exercise.     PT Home Exercise Plan continue, nothing new added.    Consulted and Agree with Plan of Care Patient        Problem List There are no active problems to display for this patient.   [facility] 01/07/2015, 1:18 PM  [facility] [address] [place], [place], [address] Phone: [phone]   Fax:  [phone]  Name: Amy Neal MRN: [id number] Date of Birth: 1995-03-06    Melvenia Needles, PTA 01/07/2015 1:18 PM Phone: [phone] Fax: [phone]

## 2015-01-12 ENCOUNTER — Ambulatory Visit: Payer: Medicaid Other | Admitting: Physical Therapy

## 2015-01-12 DIAGNOSIS — R29898 Other symptoms and signs involving the musculoskeletal system: Secondary | ICD-10-CM

## 2015-01-12 DIAGNOSIS — M25561 Pain in right knee: Secondary | ICD-10-CM | POA: Diagnosis not present

## 2015-01-12 DIAGNOSIS — M25661 Stiffness of right knee, not elsewhere classified: Secondary | ICD-10-CM

## 2015-01-12 DIAGNOSIS — R600 Localized edema: Secondary | ICD-10-CM

## 2015-01-12 DIAGNOSIS — R269 Unspecified abnormalities of gait and mobility: Secondary | ICD-10-CM

## 2015-01-12 NOTE — Therapy (Signed)
[facility] [address] [place], [place], [address] Phone: [phone]   Fax:  [phone]  Physical Therapy Treatment  Patient Details  Name: Amy Neal MRN: [id number] Date of Birth: 1995/12/27 Referring Provider: Gean Birchwood MD  Encounter Date: 01/12/2015      PT End of Session - 01/12/15 0844    Visit Number 7   Number of Visits 16   Date for PT Re-Evaluation 02/02/15   PT Start Time 0800   PT Stop Time 0858   PT Time Calculation (min) 58 min   Activity Tolerance Patient tolerated treatment well   Behavior During Therapy [facility] for tasks assessed/performed      Past Medical History  Diagnosis Date  . Right ACL tear 11/2014  . Medial meniscus tear 11/2014    right knee  . Dental crowns present     also dental caps    Past Surgical History  Procedure Laterality Date  . Wisdom tooth extraction    . Knee arthroscopy with anterior cruciate ligament (acl) repair Right 11/23/2014    Procedure: KNEE ARTHROSCOPY WITH ANTERIOR CRUCIATE LIGAMENT (ACL) REPAIR PARTIAL INTERIOR MEDIAL MENISCECTOMY;  Surgeon: Gean Birchwood, MD;  Location: [facility];  Service: Orthopedics;  Laterality: Right;  . Knee arthroscopy with medial menisectomy Left 11/23/2014    Procedure: KNEE ARTHROSCOPY WITH MEDIAL MENISECTOMY;  Surgeon: Gean Birchwood, MD;  Location: [facility];  Service: Orthopedics;  Laterality: Left;    There were no vitals filed for this visit.  Visit Diagnosis:  Right knee pain  Weakness of right leg  Decreased ROM of right knee  Abnormality of gait  Localized edema      Subjective Assessment - 01/12/15 0806    Subjective No pain, just stiff.  Sometimes swells when I'm up on it more.     Currently in Pain? No/denies            [facility] PT Assessment - 01/12/15 0842    ROM / Strength   AROM / PROM / Strength Strength   AROM   Right Knee Extension 6   Right Knee Flexion 120   Strength   Right Knee Flexion 4+/5   Right Knee Extension 4-/5                     [facility] Adult PT Treatment/Exercise - 01/12/15 0813    Knee/Hip Exercises: Aerobic   Stationary Bike L2, 6 min slower, needs cues to increase effort   Knee/Hip Exercises: Machines for Strengthening   Cybex Knee Extension 1 plate x 15 slow and controlled    Cybex Leg Press 1 plate x 15, did single leg x 10 with assist   Knee/Hip Exercises: Standing   Heel Raises Both;1 set;20 reps   Lateral Step Up Right;1 set;10 reps;Hand Hold: 1;Step Height: 8"   Forward Step Up Right;1 set;10 reps;Hand Hold: 0;Step Height: 8"   Functional Squat 1 set   Other Standing Knee Exercises standing TKE ball into wall    Other Standing Knee Exercises Rt. glute med drops   off step with 1 HHA   Knee/Hip Exercises: Supine   Quad Sets Strengthening;Right;1 set;10 reps   Short Arc Quad Sets Strengthening;Right;1 set;10 reps   Short Arc Quad Sets Limitations 5 lbs hold 10 sec   Straight Leg Raises Strengthening;Right;1 set;10 reps   Straight Leg Raise with External Rotation Strengthening;Right;1 set;10 reps   Vasopneumatic   Number Minutes Vasopneumatic  15 minutes  Vasopnuematic Location  Knee   Vasopneumatic Pressure Medium   Vasopneumatic Temperature  32                PT Education - 01/12/15 0844    Education provided Yes   Education Details strengthening, gym for light weights and recumbant bike   Person(s) Educated Patient   Methods Explanation   Comprehension Verbalized understanding;Returned demonstration          PT Short Term Goals - 01/12/15 0807    PT SHORT TERM GOAL #1   Title pt will be I with initial HEP (01/07/2014)   Status Achieved   PT SHORT TERM GOAL #2   Title pt will increase her R Knee to 0 - 105 with </= 4/10 pain to promote functional progression (01/07/2014)   Status Achieved   PT SHORT TERM GOAL #3   Title pt will be able to elicit a full quadricep contraction to work  towards safety with weight bearing activities (01/07/2014)   Status Achieved   PT SHORT TERM GOAL #4   Title pt will be able to verbalize and demonstrate techniques to control R knee pain and inflammation via RICE method (01/07/2014)   Status Achieved   PT SHORT TERM GOAL #5   Title pt will be able to walk/ standing for >/= 10 minutes with </= 4/10 pain to promote standing and walking endurance(01/07/2014)   Status Achieved           PT Long Term Goals - 01/12/15 [id number]    PT LONG TERM GOAL #1   Title pt will be I with all HEP at discharge (02/02/2015)   Status On-going   PT LONG TERM GOAL #2   Title pt will dmonstrate improved R knee AROM flexion to >/=120 and extension 0 with </=2/10 pain to assist with functional and efficient gait pattern (02/02/2015)   Baseline flexion 120 deg, ext 6   Status On-going   PT LONG TERM GOAL #3   Title pt will demonstrate R knee strength to >/=4/5 strength to promote safety with prolonged standing / walking activities associated with school requirements (02/02/2015)   Status On-going   PT LONG TERM GOAL #4   Title pt will be able to walk/ stand for >/=20 minutes with </=2/10 pain to help with endurance with prolonged weight bearing activities ( 02/02/2015)   Status On-going   PT LONG TERM GOAL #5   Title pt will improve her FOTO score to >/= 55 to demonstrate improved function at discharge (02/02/2015)   Status Unable to assess               Plan - 01/12/15 1210    Clinical Impression Statement Reviewed ex for strengthening, did mostly closed chain today and reported no pain post.  She has a good quad contraction but decr muscle endurance for functional activities.    PT Next Visit Plan continue protocol, add lunges to home exercise.     PT Home Exercise Plan continue, nothing new added.    Consulted and Agree with Plan of Care Patient        Problem List There are no active problems to display for this patient.   Garion Wempe 01/12/2015,  12:16 PM  [facility] [address] [place], [place], [address] Phone: [phone]   Fax:  [phone]  Name: Amy Neal MRN: [id number] Date of Birth: 07/22/1995

## 2015-01-15 ENCOUNTER — Ambulatory Visit: Payer: Medicaid Other | Admitting: Physical Therapy

## 2015-01-15 DIAGNOSIS — R600 Localized edema: Secondary | ICD-10-CM

## 2015-01-15 DIAGNOSIS — R29898 Other symptoms and signs involving the musculoskeletal system: Secondary | ICD-10-CM

## 2015-01-15 DIAGNOSIS — M25661 Stiffness of right knee, not elsewhere classified: Secondary | ICD-10-CM

## 2015-01-15 DIAGNOSIS — R269 Unspecified abnormalities of gait and mobility: Secondary | ICD-10-CM

## 2015-01-15 DIAGNOSIS — M25561 Pain in right knee: Secondary | ICD-10-CM | POA: Diagnosis not present

## 2015-01-15 NOTE — Therapy (Signed)
[facility] [address] [place], [place], [address] Phone: [phone]   Fax:  [phone]  Physical Therapy Treatment  Patient Details  Name: Amy Neal MRN: [id number] Date of Birth: 10/03/1995 Referring Provider: Gean BirchwoodFrank Rowan MD  Encounter Date: 01/15/2015      PT End of Session - 01/15/15 0807    Visit Number 8   Number of Visits 16   Date for PT Re-Evaluation 02/02/15   PT Start Time 0803   PT Stop Time 0843   PT Time Calculation (min) 40 min   Activity Tolerance Patient tolerated treatment well   Behavior [facility] for tasks assessed/performed      Past Medical History  Diagnosis Date  . Right ACL tear 11/2014  . Medial meniscus tear 11/2014    right knee  . Dental crowns present     also dental caps    Past Surgical History  Procedure Laterality Date  . Wisdom tooth extraction    . Knee arthroscopy with anterior cruciate ligament (acl) repair Right 11/23/2014    Procedure: KNEE ARTHROSCOPY WITH ANTERIOR CRUCIATE LIGAMENT (ACL) REPAIR PARTIAL INTERIOR MEDIAL MENISCECTOMY;  Surgeon: Gean BirchwoodFrank Rowan, MD;  Location: [facility];  Service: Orthopedics;  Laterality: Right;  . Knee arthroscopy with medial menisectomy Left 11/23/2014    Procedure: KNEE ARTHROSCOPY WITH MEDIAL MENISECTOMY;  Surgeon: Gean BirchwoodFrank Rowan, MD;  Location: [facility];  Service: Orthopedics;  Laterality: Left;    There were no vitals filed for this visit.  Visit Diagnosis:  Right knee pain  Weakness of right leg  Decreased ROM of right knee  Abnormality of gait  Localized edema      Subjective Assessment - 01/15/15 0807    Subjective No complaints today, no pain.     Currently in Pain? No/denies          [facility]/Exercise - 01/15/15 0808    Knee/Hip Exercises: Stretches   Active Hamstring Stretch Both;2 reps   Other Knee/Hip Stretches ITB 30 sec x 2   Knee/Hip Exercises:  Aerobic   Elliptical 5 ramp, level 1 resistance for 5 min warm up, ROM    Knee/Hip Exercises: Standing   Forward Lunges Right;1 set;5 reps   Forward Step Up Right;1 set;Hand Hold: 0   Forward Step Up Limitations 2 min    Step Down 2 sets;Hand Hold: 1;Step Height: 6"   Functional Squat 2 sets;10 reps   Knee/Hip Exercises: Sidelying   Hip ABduction Strengthening;Left;1 set;10 reps   Clams 20   Other Sidelying Knee/Hip Exercises sidekick series x 10                   PT Short Term Goals - 01/12/15 [id number]    PT SHORT TERM GOAL #1   Title pt will be I with initial HEP (01/07/2014)   Status Achieved   PT SHORT TERM GOAL #2   Title pt will increase her R Knee to 0 - 105 with </= 4/10 pain to promote functional progression (01/07/2014)   Status Achieved   PT SHORT TERM GOAL #3   Title pt will be able to elicit a full quadricep contraction to work towards safety with weight bearing activities (01/07/2014)   Status Achieved   PT SHORT TERM GOAL #4   Title pt will be able to verbalize and demonstrate techniques to control R knee pain and inflammation via RICE method (01/07/2014)   Status Achieved   PT SHORT  TERM GOAL #5   Title pt will be able to walk/ standing for >/= 10 minutes with </= 4/10 pain to promote standing and walking endurance(01/07/2014)   Status Achieved           PT Long Term Goals - 01/12/15 1610    PT LONG TERM GOAL #1   Title pt will be I with all HEP at discharge (02/02/2015)   Status On-going   PT LONG TERM GOAL #2   Title pt will dmonstrate improved R knee AROM flexion to >/=120 and extension 0 with </=2/10 pain to assist with functional and efficient gait pattern (02/02/2015)   Baseline flexion 120 deg, ext 6   Status On-going   PT LONG TERM GOAL #3   Title pt will demonstrate R knee strength to >/=4/5 strength to promote safety with prolonged standing / walking activities associated with school requirements (02/02/2015)   Status On-going   PT LONG TERM GOAL #4    Title pt will be able to walk/ stand for >/=20 minutes with </=2/10 pain to help with endurance with prolonged weight bearing activities ( 02/02/2015)   Status On-going   PT LONG TERM GOAL #5   Title pt will improve her FOTO score to >/= 55 to demonstrate improved function at discharge (02/02/2015)   Status Unable to assess               Plan - 01/15/15 0843    Clinical Impression Statement Streamlined HEP. No pain increase during session. She is only concerned about stair negotiation is able to walk around campus without pain most of the time.  Will assess need to cont PT after next week.     PT Next Visit Plan continue protocol, progress CCK ex.     PT Home Exercise Plan continue, nothing new added, is I    Consulted and Agree with Plan of Care Patient        Problem List There are no active problems to display for this patient.   Amy Neal 01/15/2015, 8:44 AM  [facility] [address] [place], [place], [address] Phone: [phone]   Fax:  [phone]  Name: Amy Neal MRN: [id number] Date of Birth: [birth year]    Karie Mainland, PT 01/15/2015 8:44 AM Phone: [phone] Fax: [phone]

## 2015-01-19 ENCOUNTER — Ambulatory Visit: Payer: Medicaid Other | Admitting: Physical Therapy

## 2015-01-19 DIAGNOSIS — R600 Localized edema: Secondary | ICD-10-CM

## 2015-01-19 DIAGNOSIS — R29898 Other symptoms and signs involving the musculoskeletal system: Secondary | ICD-10-CM

## 2015-01-19 DIAGNOSIS — M25661 Stiffness of right knee, not elsewhere classified: Secondary | ICD-10-CM

## 2015-01-19 DIAGNOSIS — M25561 Pain in right knee: Secondary | ICD-10-CM | POA: Diagnosis not present

## 2015-01-19 DIAGNOSIS — R269 Unspecified abnormalities of gait and mobility: Secondary | ICD-10-CM

## 2015-01-19 NOTE — Therapy (Signed)
[place], [place], [address] Phone: [phone]   Fax:  [phone]  Physical Therapy Treatment  Patient Details  Name: Amy Neal MRN: [id number] Date of Birth: 03-Sep-1995 Referring Provider: Frederik Pear MD  Encounter Date: 01/19/2015      PT End of Session - 01/19/15 0840    Visit Number 9   Number of Visits 16   Date for PT Re-Evaluation 02/02/15   PT Start Time 0800   PT Stop Time 0839   PT Time Calculation (min) 39 min   Activity Tolerance Patient tolerated treatment well   Behavior During Therapy [facility] for tasks assessed/performed      Past Medical History  Diagnosis Date  . Right ACL tear 11/2014  . Medial meniscus tear 11/2014    right knee  . Dental crowns present     also dental caps    Past Surgical History  Procedure Laterality Date  . Wisdom tooth extraction    . Knee arthroscopy with anterior cruciate ligament (acl) repair Right 11/23/2014    Procedure: KNEE ARTHROSCOPY WITH ANTERIOR CRUCIATE LIGAMENT (ACL) REPAIR PARTIAL INTERIOR MEDIAL MENISCECTOMY;  Surgeon: Frederik Pear, MD;  Location: [place];  Service: Orthopedics;  Laterality: Right;  . Knee arthroscopy with medial menisectomy Left 11/23/2014    Procedure: KNEE ARTHROSCOPY WITH MEDIAL MENISECTOMY;  Surgeon: Frederik Pear, MD;  Location: [place];  Service: Orthopedics;  Laterality: Left;    There were no vitals filed for this visit.  Visit Diagnosis:  Right knee pain  Weakness of right leg  Decreased ROM of right knee  Abnormality of gait  Localized edema      Subjective Assessment - 01/19/15 0803    Subjective No pain today, has not swelled lately.     Currently in Pain? No/denies           [facility] Adult PT Treatment/Exercise - 01/19/15 0805    Knee/Hip Exercises: Stretches   Knee: Self-Stretch to increase Flexion 3 reps;30 seconds   Knee: Self-Stretch Limitations quad  stretch   to AAROM in prone 122 deg   Piriformis Stretch Both;2 reps;30 seconds   Knee/Hip Exercises: Aerobic   Nustep L8 legs only 5 min    Knee/Hip Exercises: Supine   Bridges Limitations x 10    Bridges with Clamshell Strengthening;Both;1 set;10 reps   Single Leg Bridge Strengthening;Both;1 set;10 reps   Straight Leg Raises Strengthening;Right;1 set;20 reps   Straight Leg Raises Limitations 3 lb   Straight Leg Raise with External Rotation Strengthening;Right;1 set;20 reps   Straight Leg Raise with External Rotation Limitations 3 lb   Knee/Hip Exercises: Prone   Hamstring Curl 2 sets;10 reps   Hamstring Curl Limitations 3 lbs   Hip Extension Strengthening;Right;2 sets;10 reps                PT Education - 01/19/15 0839    Education provided Yes   Education Details lateral stab of knee   Person(s) Educated Patient   Methods Explanation   Comprehension Verbalized understanding          PT Short Term Goals - 01/12/15 0807    PT SHORT TERM GOAL #1   Title pt will be I with initial HEP (01/07/2014)   Status Achieved   PT SHORT TERM GOAL #2   Title pt will increase her R Knee to 0 - 105 with </= 4/10 pain to promote functional progression (01/07/2014)   Status Achieved  PT SHORT TERM GOAL #3   Title pt will be able to elicit a full quadricep contraction to work towards safety with weight bearing activities (01/07/2014)   Status Achieved   PT SHORT TERM GOAL #4   Title pt will be able to verbalize and demonstrate techniques to control R knee pain and inflammation via RICE method (01/07/2014)   Status Achieved   PT SHORT TERM GOAL #5   Title pt will be able to walk/ standing for >/= 10 minutes with </= 4/10 pain to promote standing and walking endurance(01/07/2014)   Status Achieved           PT Long Term Goals - 01/12/15 0415    PT LONG TERM GOAL #1   Title pt will be I with all HEP at discharge (02/02/2015)   Status On-going   PT LONG TERM GOAL #2   Title pt will  dmonstrate improved R knee AROM flexion to >/=120 and extension 0 with </=2/10 pain to assist with functional and efficient gait pattern (02/02/2015)   Baseline flexion 120 deg, ext 6   Status On-going   PT LONG TERM GOAL #3   Title pt will demonstrate R knee strength to >/=4/5 strength to promote safety with prolonged standing / walking activities associated with school requirements (02/02/2015)   Status On-going   PT LONG TERM GOAL #4   Title pt will be able to walk/ stand for >/=20 minutes with </=2/10 pain to help with endurance with prolonged weight bearing activities ( 02/02/2015)   Status On-going   PT LONG TERM GOAL #5   Title pt will improve her FOTO score to >/= 55 to demonstrate improved function at discharge (02/02/2015)   Status Unable to assess               Plan - 01/19/15 0840    Clinical Impression Statement Advanced to lateral stability training but with some degree of diffculty, cues to avoid compensations.  Progressed to stretching in prone, declined ice due to need to get to school.  No further goals met.    PT Next Visit Plan continue protocol, progress CCK ex.  FOTO?   PT Home Exercise Plan continue, nothing new added, is I    Consulted and Agree with Plan of Care Patient        Problem List There are no active problems to display for this patient.   PAA,JENNIFER 01/19/2015, 8:42 AM  [facility] [address] [place], [place], [address] Phone: [phone]   Fax:  [phone]  Name: Amy Neal MRN: [id number] Date of Birth: [birth year]   Raeford Razor, PT 01/19/2015 8:43 AM Phone: [phone] Fax: [phone]

## 2015-01-22 ENCOUNTER — Ambulatory Visit: Payer: Medicaid Other | Admitting: Physical Therapy

## 2015-01-22 DIAGNOSIS — R269 Unspecified abnormalities of gait and mobility: Secondary | ICD-10-CM

## 2015-01-22 DIAGNOSIS — R600 Localized edema: Secondary | ICD-10-CM

## 2015-01-22 DIAGNOSIS — M25661 Stiffness of right knee, not elsewhere classified: Secondary | ICD-10-CM

## 2015-01-22 DIAGNOSIS — R29898 Other symptoms and signs involving the musculoskeletal system: Secondary | ICD-10-CM

## 2015-01-22 DIAGNOSIS — M25561 Pain in right knee: Secondary | ICD-10-CM | POA: Diagnosis not present

## 2015-01-22 NOTE — Therapy (Signed)
[place], [place], [address] Phone: [phone]   Fax:  [phone]  Physical Therapy Treatment  Patient Details  Name: Amy Neal MRN: [id number] Date of Birth: 01/24/1995 Referring Provider: Frederik Pear MD  Encounter Date: 01/22/2015      PT End of Session - 01/22/15 0815    Visit Number 10   PT Start Time 0807  pt a few min late   PT Stop Time 9753   PT Time Calculation (min) 40 min      Past Medical History  Diagnosis Date  . Right ACL tear 11/2014  . Medial meniscus tear 11/2014    right knee  . Dental crowns present     also dental caps    Past Surgical History  Procedure Laterality Date  . Wisdom tooth extraction    . Knee arthroscopy with anterior cruciate ligament (acl) repair Right 11/23/2014    Procedure: KNEE ARTHROSCOPY WITH ANTERIOR CRUCIATE LIGAMENT (ACL) REPAIR PARTIAL INTERIOR MEDIAL MENISCECTOMY;  Surgeon: Frederik Pear, MD;  Location: [place];  Service: Orthopedics;  Laterality: Right;  . Knee arthroscopy with medial menisectomy Left 11/23/2014    Procedure: KNEE ARTHROSCOPY WITH MEDIAL MENISECTOMY;  Surgeon: Frederik Pear, MD;  Location: [place];  Service: Orthopedics;  Laterality: Left;    There were no vitals filed for this visit.  Visit Diagnosis:  Right knee pain  Weakness of right leg  Decreased ROM of right knee  Abnormality of gait  Localized edema      Subjective Assessment - 01/22/15 0811    Subjective No complaints today. Says she can walk sround campus but feels increased pain if >20 min-30 min.  She would like to cont 1 x per week, school is busy.    Currently in Pain? No/denies            [facility] PT Assessment - 01/22/15 1045    Observation/Other Assessments   Focus on Therapeutic Outcomes (FOTO)  51%          [facility] Adult PT Treatment/Exercise - 01/22/15 0822    Knee/Hip Exercises: Stretches   Active  Hamstring Stretch Right;3 reps;30 seconds   Knee/Hip Exercises: Aerobic   Elliptical level 1 resistance and level 5 ramp    Knee/Hip Exercises: Standing   Wall Squat 1 set;15 reps   Rebounder 3 min single leg   added deep squat rebounding and tandem x 2-3 min no asst nee   Other Standing Knee Exercises walking lunge x 25 feet x 2   Knee/Hip Exercises: Seated   Long Arc Quad Strengthening;Right;1 set;20 reps   Long Arc Quad Weight 5 lbs.   Long [employer] Limitations 3 sets, done with ball squeeze and slight turn out for [employer] Squeeze x 10    Sit to [employer] 10 reps;without UE support;Other (comment)  ball between knees, 2 sets (1 squat)                PT Education - 01/22/15 1045    Education provided Yes   Education Details DC vs cont 1 time per week, progress   Person(s) Educated Patient   Methods Explanation   Comprehension Verbalized understanding          PT Short Term Goals - 01/22/15 0817    PT SHORT TERM GOAL #1   Title pt will be I with initial HEP (01/07/2014)   Status Achieved   PT SHORT TERM  GOAL #2   Title pt will increase her R Knee to 0 - 105 with </= 4/10 pain to promote functional progression (01/07/2014)   Status Achieved   PT SHORT TERM GOAL #3   Title pt will be able to elicit a full quadricep contraction to work towards safety with weight bearing activities (01/07/2014)   Status Achieved   PT SHORT TERM GOAL #4   Title pt will be able to verbalize and demonstrate techniques to control R knee pain and inflammation via RICE method (01/07/2014)   Status Achieved   PT SHORT TERM GOAL #5   Title pt will be able to walk/ standing for >/= 10 minutes with </= 4/10 pain to promote standing and walking endurance(01/07/2014)   Status Achieved           PT Long Term Goals - 01/22/15 0818    PT LONG TERM GOAL #1   Title pt will be I with all HEP at discharge (02/02/2015)   Status Achieved   PT LONG TERM GOAL #2   Title pt will dmonstrate improved R knee  AROM flexion to >/=120 and extension 0 with </=2/10 pain to assist with functional and efficient gait pattern (02/02/2015)   Status Achieved   PT LONG TERM GOAL #3   Title pt will demonstrate R knee strength to >/=4/5 strength to promote safety with prolonged standing / walking activities associated with school requirements (02/02/2015)   Status Achieved   PT LONG TERM GOAL #4   Title pt will be able to walk/ stand for >/=20 minutes with </=2/10 pain to help with endurance with prolonged weight bearing activities ( 02/02/2015)   Status Partially Met   PT LONG TERM GOAL #5   Title pt will improve her FOTO score to >/= 55 to demonstrate improved function at discharge (02/02/2015)   Baseline 51%   Status Achieved               Plan - 01/22/15 1046    Clinical Impression Statement Patient cont to lack extension of knee (strength and ROM), quad muscel activation and could use higher level knee exercises to ensure stability.  Will cont 1 time per week for 6 more weeks.    PT Next Visit Plan closed chain, quad control, stretch hamstrings, try Reformer?   PT Home Exercise Plan continue, nothing new added, is I    Consulted and Agree with Plan of Care Patient        Problem List There are no active problems to display for this patient.   PAA,JENNIFER 01/22/2015, 10:49 AM  [place], [place], [address] Phone: [phone]   Fax:  [phone]  Name: BRAYLIN FORMBY MRN: [id number] Date of Birth: [birth year]  Raeford Razor, PT 01/22/2015 10:50 AM Phone: [phone] Fax: [phone]

## 2015-01-26 ENCOUNTER — Encounter: Payer: Medicaid Other | Admitting: Physical Therapy

## 2015-01-29 ENCOUNTER — Ambulatory Visit: Payer: Medicaid Other | Admitting: Physical Therapy

## 2015-01-29 DIAGNOSIS — M25561 Pain in right knee: Secondary | ICD-10-CM

## 2015-01-29 DIAGNOSIS — M25661 Stiffness of right knee, not elsewhere classified: Secondary | ICD-10-CM

## 2015-01-29 DIAGNOSIS — R269 Unspecified abnormalities of gait and mobility: Secondary | ICD-10-CM

## 2015-01-29 DIAGNOSIS — R600 Localized edema: Secondary | ICD-10-CM

## 2015-01-29 DIAGNOSIS — R29898 Other symptoms and signs involving the musculoskeletal system: Secondary | ICD-10-CM

## 2015-01-29 NOTE — Patient Instructions (Signed)
Knee Extension Mobilization: Hang (Prone)    With table supporting thighs, place __0__ pound weight on right ankle. Hold ___2_ minutes. Repeat __1__ times per set. Do _1___ sets per session. Do __2-3__ sessions per day.  USE RED BAND AROUND LEFT ANKLE AND MAINTAIN BALANCE IN RIGHT LEG FOR THE FOLLOWING EXERCISES Knee High  STRAIGHT LEG Holding stable object, raise knee to hip level, then lower knee. Repeat with other knee. Complete __10_ repetitions. Do __2__ sessions per day.  ABDUCTION: Standing (Active)   Stand, feet flat. Lift right leg out to side. Use _0__ lbs. Complete __10_ repetitions. Perform __2_ sessions per day.  ADDUCTION: Standing - Stable (Active)   Stand, right leg out to side as far as possible. Draw leg in across midline. Use _0__ lbs. Complete 10_ repetitions. Perform _2__ sessions per day.       EXTENSION: Standing (Active)  Stand, both feet flat. Draw right leg behind body as far as possible. Use 0___ lbs. Complete 10 repetitions. Perform __2_ sessions per day.  Copyright  VHI. All rights reserved.

## 2015-01-29 NOTE — Therapy (Addendum)
[place], [place], [address] Phone: [phone]   Fax:  [phone]  Physical Therapy Treatment  Patient Details  Name: Amy Neal MRN: [id number] Date of Birth: [birth year] Referring Provider: Frederik Pear MD  Encounter Date: 01/29/2015      PT End of Session - 01/29/15 0818    Visit Number 11   Number of Visits 16   Date for PT Re-Evaluation 02/02/15   Authorization Type Medicaid   PT Start Time 0805   PT Stop Time 0850   PT Time Calculation (min) 45 min      Past Medical History  Diagnosis Date  . Right ACL tear 11/2014  . Medial meniscus tear 11/2014    right knee  . Dental crowns present     also dental caps    Past Surgical History  Procedure Laterality Date  . Wisdom tooth extraction    . Knee arthroscopy with anterior cruciate ligament (acl) repair Right 11/23/2014    Procedure: KNEE ARTHROSCOPY WITH ANTERIOR CRUCIATE LIGAMENT (ACL) REPAIR PARTIAL INTERIOR MEDIAL MENISCECTOMY;  Surgeon: Frederik Pear, MD;  Location: [place];  Service: Orthopedics;  Laterality: Right;  . Knee arthroscopy with medial menisectomy Left 11/23/2014    Procedure: KNEE ARTHROSCOPY WITH MEDIAL MENISECTOMY;  Surgeon: Frederik Pear, MD;  Location: [place];  Service: Orthopedics;  Laterality: Left;    There were no vitals filed for this visit.  Visit Diagnosis:  Right knee pain  Weakness of right leg  Decreased ROM of right knee  Abnormality of gait  Localized edema          [facility] PT Assessment - 01/29/15 0001    AROM   Right Knee Flexion 125                     [facility] Adult PT Treatment/Exercise - 01/29/15 0807    Knee/Hip Exercises: Stretches   Active Hamstring Stretch Right;3 reps;30 seconds   Knee: Self-Stretch to increase Flexion 3 reps;30 seconds   Knee: Self-Stretch Limitations quad stretch   to AAROM in prone 122 deg   Other Knee/Hip  Stretches prone knee hang x30 sec -HEP   Knee/Hip Exercises: Aerobic   Elliptical level 2 ramp 5 x 6 min   Knee/Hip Exercises: Machines for Strengthening   Total Gym Leg Press 2 plates bilateral and then 1 plate single    Knee/Hip Exercises: Standing   Lateral Step Up Right;15 reps;Hand Hold: 1;Step Height: 4"   Step Down Right;Step Height: 4";Hand Hold: 1;15 reps   Wall Squat 1 set;15 reps  added 5 sec holds   SLS with Vectors 4 way with red band maintaining SLS on Right x 10 each   Rebounder 3 min single leg    Other Standing Knee Exercises walking lunge x 25 feet x 2   Other Standing Knee Exercises static lunges 10 each in doorway                PT Education - 01/29/15 0900    Education provided Yes   Education Details Prone knee hang, 4 way hip, static lunge   Person(s) Educated Patient   Methods Explanation;Handout;Demonstration   Comprehension Verbalized understanding          PT Short Term Goals - 01/29/15 1122    PT SHORT TERM GOAL #1   Title pt will be I with initial HEP (01/07/2014)   Status Achieved   PT SHORT TERM  GOAL #2   Title pt will increase her R Knee to 0 - 105 with </= 4/10 pain to promote functional progression (01/07/2014)   Status Achieved   PT SHORT TERM GOAL #3   Title pt will be able to elicit a full quadricep contraction to work towards safety with weight bearing activities (01/07/2014)   Status Achieved   PT SHORT TERM GOAL #4   Title pt will be able to verbalize and demonstrate techniques to control R knee pain and inflammation via RICE method (01/07/2014)   Status Achieved   PT SHORT TERM GOAL #5   Title pt will be able to walk/ standing for >/= 10 minutes with </= 4/10 pain to promote standing and walking endurance(01/07/2014)   Status Achieved           PT Long Term Goals - 01/29/15 1123    PT LONG TERM GOAL #1   Title pt will be I with all HEP at discharge (02/02/2015)   Baseline working towards I    Status On-going   PT LONG TERM  GOAL #2   Title pt will dmonstrate improved R knee AROM flexion to >/=120 and extension 0 with </=2/10 pain to assist with functional and efficient gait pattern (02/02/2015)   Status Achieved   PT LONG TERM GOAL #3   Title pt will demonstrate R knee strength to >/=4/5 strength to promote safety with prolonged standing / walking activities associated with school requirements (02/02/2015)   Baseline 4+/5 hamstrings, 4-/5 quads   Status On-going   PT LONG TERM GOAL #4   Title pt will be able to walk/ stand for >/=20 minutes with </=2/10 pain to help with endurance with prolonged weight bearing activities ( 02/02/2015)   Baseline Pain increases to moderate with over 20 min walking.    Status Partially Met   PT LONG TERM GOAL #5   Title pt will improve her FOTO score to >/= 55 to demonstrate improved function at discharge (02/02/2015)   Baseline 51%   Status Achieved               Plan - 01/29/15 0856    Clinical Impression Statement Instructed pt in standing 4 way hip for proprioception. Focused quad strength and SLS activities. Reviewed stretches and recommended prone knee hang for HEP to recover extension ROM. Pt feels good stretch. She reports stretching 2 x per week. Recommended she increase to every day due to continued lack of full extension. Pt verbalized understanding. No increased pain and declined ice post session.    PT Next Visit Plan closed chain, quad control, SLS activites-review 4 way hip,  stretch hamstrings, try Reformer?        Problem List There are no active problems to display for this patient.   PAA,JENNIFER, PTA 01/29/2015, 11:25 AM  [place], [place], [address] Phone: [phone]   Fax:  [phone]  Name: STEFFANI DIONISIO MRN: [id number] Date of Birth: [birth year]  Raeford Razor, PT 01/29/2015 11:25 AM Phone: [phone] Fax: [phone]

## 2015-02-05 ENCOUNTER — Ambulatory Visit: Payer: Medicaid Other | Attending: Orthopedic Surgery | Admitting: Physical Therapy

## 2015-02-05 DIAGNOSIS — M25661 Stiffness of right knee, not elsewhere classified: Secondary | ICD-10-CM

## 2015-02-05 DIAGNOSIS — R600 Localized edema: Secondary | ICD-10-CM

## 2015-02-05 DIAGNOSIS — R29898 Other symptoms and signs involving the musculoskeletal system: Secondary | ICD-10-CM | POA: Diagnosis present

## 2015-02-05 DIAGNOSIS — M25561 Pain in right knee: Secondary | ICD-10-CM | POA: Diagnosis not present

## 2015-02-05 DIAGNOSIS — R269 Unspecified abnormalities of gait and mobility: Secondary | ICD-10-CM | POA: Diagnosis present

## 2015-02-05 NOTE — Therapy (Signed)
[facility] [address] [place], [place], [address] Phone: [phone]   Fax:  [phone]  Physical Therapy Treatment  Patient Details  Name: Amy Neal MRN: [id number] Date of Birth: [birth year] Referring Provider: Gean Birchwood MD  Encounter Date: 02/05/2015      PT End of Session - 02/05/15 0818    Visit Number 12   Number of Visits 16   Date for PT Re-Evaluation 03/05/15   PT Start Time 0803   PT Stop Time 0844   PT Time Calculation (min) 41 min   Activity Tolerance Patient tolerated treatment well   Behavior During Therapy [facility] for tasks assessed/performed      Past Medical History  Diagnosis Date  . Right ACL tear 11/2014  . Medial meniscus tear 11/2014    right knee  . Dental crowns present     also dental caps    Past Surgical History  Procedure Laterality Date  . Wisdom tooth extraction    . Knee arthroscopy with anterior cruciate ligament (acl) repair Right 11/23/2014    Procedure: KNEE ARTHROSCOPY WITH ANTERIOR CRUCIATE LIGAMENT (ACL) REPAIR PARTIAL INTERIOR MEDIAL MENISCECTOMY;  Surgeon: Gean Birchwood, MD;  Location: [facility];  Service: Orthopedics;  Laterality: Right;  . Knee arthroscopy with medial menisectomy Left 11/23/2014    Procedure: KNEE ARTHROSCOPY WITH MEDIAL MENISECTOMY;  Surgeon: Gean Birchwood, MD;  Location: [facility];  Service: Orthopedics;  Laterality: Left;    There were no vitals filed for this visit.  Visit Diagnosis:  Right knee pain  Weakness of right leg  Decreased ROM of right knee  Abnormality of gait  Localized edema      Subjective Assessment - 02/05/15 0806    Subjective I feel like my leg is getting stiffer, tighter.  I have to take more breaks to work on my knee   Currently in Pain? No/denies            [facility] PT Assessment - 02/05/15 0808    AROM   Right Knee Extension 6   Right Knee Flexion 115   Strength   Right Knee  Flexion 4+/5   Right Knee Extension 4+/5   High Level Balance   High Level Balance Comments static balance 30 sec         Pilates Reformer used for LE/core strength, postural strength, lumbopelvic disassociation and core control.  Exercises included: Footwork Bridging 2 Red1 blue with ball x 10 and added push out  Sidelying legs 2 Red parallel, heel with ER and forefoot with ER x 10 each mod tactile cues to maintain position Piriformis stretch post               [facility] Adult PT Treatment/Exercise - 02/05/15 0808    Knee/Hip Exercises: Stretches   Active Hamstring Stretch 2 reps;30 seconds   Knee/Hip Exercises: Aerobic   Elliptical level 5, ramp 9 for 5 min to warm up, less stiffness after     Walking side lunge for hip strengthening 4 x 10 feet           PT Education - 02/05/15 0824    Education provided Yes   Education Details Pilates Reformer    Person(s) Educated Patient   Methods Explanation   Comprehension Verbalized understanding          PT Short Term Goals - 01/29/15 1122    PT SHORT TERM GOAL #1   Title pt will be I with initial  HEP (01/07/2014)   Status Achieved   PT SHORT TERM GOAL #2   Title pt will increase her R Knee to 0 - 105 with </= 4/10 pain to promote functional progression (01/07/2014)   Status Achieved   PT SHORT TERM GOAL #3   Title pt will be able to elicit a full quadricep contraction to work towards safety with weight bearing activities (01/07/2014)   Status Achieved   PT SHORT TERM GOAL #4   Title pt will be able to verbalize and demonstrate techniques to control R knee pain and inflammation via RICE method (01/07/2014)   Status Achieved   PT SHORT TERM GOAL #5   Title pt will be able to walk/ standing for >/= 10 minutes with </= 4/10 pain to promote standing and walking endurance(01/07/2014)   Status Achieved           PT Long Term Goals - 02/05/15 0848    PT LONG TERM GOAL #1   Title pt will be I with all HEP at discharge  (02/02/2015)   Status On-going   PT LONG TERM GOAL #2   Title pt will dmonstrate improved R knee AROM flexion to >/=120 and extension 0 with </=2/10 pain to assist with functional and efficient gait pattern (02/02/2015)   Status On-going   PT LONG TERM GOAL #3   Title pt will demonstrate R knee strength to >/=4/5 strength to promote safety with prolonged standing / walking activities associated with school requirements (02/02/2015)   Status Achieved   PT LONG TERM GOAL #4   Title pt will be able to walk/ stand for >/=20 minutes with </=2/10 pain to help with endurance with prolonged weight bearing activities ( 02/02/2015)   Status Achieved   PT LONG TERM GOAL #5   Title pt will improve her FOTO score to >/= 55 to demonstrate improved function at discharge (02/02/2015)   Status Achieved               Plan - 02/05/15 0845    Clinical Impression Statement Patient has mild weakness in quads (quad lag, lacks full ext 6 deg) , although knee ext strength is 4+/5.  She has been up on her feet alot more, may be the reason her knee feels tighter.  She will cont to benefit from 4 more weeks of PT to maximize recovery.     Pt will benefit from skilled therapeutic intervention in order to improve on the following deficits Pain;Improper body mechanics;Postural dysfunction;Difficulty walking;Hypomobility;Decreased endurance;Increased fascial restricitons;Decreased strength;Increased edema;Decreased activity tolerance;Decreased balance;Decreased mobility   PT Frequency 1x / week   PT Duration 4 weeks   PT Treatment/Interventions ADLs/Self Care Home Management;Cryotherapy;Electrical Stimulation;Iontophoresis /ml Dexamethasone;Moist Heat;Therapeutic exercise;Therapeutic activities;Taping;Dry needling;Vasopneumatic Device;Passive range of motion;Patient/family education;Ultrasound;Balance training;Gait training;Manual techniques   PT Next Visit Plan closed chain, quad control, SLS activites-review 4 way  hip,  stretch hamstrings, try Reformer?   PT Home Exercise Plan continue, nothing new added, is I    Consulted and Agree with Plan of Care Patient        Problem List There are no active problems to display for this patient.   Almeta Geisel 02/05/2015, 8:52 AM  [facility] [address] [place], [place], [address] Phone: [phone]   Fax:  [phone]  Name: Amy Neal MRN: [id number] Date of Birth: Aug 16, 1995   Karie Mainland, PT 02/05/2015 8:52 AM Phone: [phone] Fax: [phone]

## 2015-02-12 ENCOUNTER — Ambulatory Visit: Payer: Medicaid Other | Admitting: Physical Therapy

## 2015-02-19 ENCOUNTER — Encounter: Payer: Medicaid Other | Admitting: Physical Therapy

## 2015-02-22 ENCOUNTER — Encounter (HOSPITAL_COMMUNITY): Payer: Self-pay | Admitting: Emergency Medicine

## 2015-02-22 DIAGNOSIS — N939 Abnormal uterine and vaginal bleeding, unspecified: Secondary | ICD-10-CM | POA: Insufficient documentation

## 2015-02-22 LAB — CBC WITH DIFFERENTIAL/PLATELET
Basophils Absolute: 0 10*3/uL (ref 0.0–0.1)
Basophils Relative: 0 %
Eosinophils Absolute: 0.1 10*3/uL (ref 0.0–0.7)
Eosinophils Relative: 1 %
HEMATOCRIT: 36.2 % (ref 36.0–46.0)
Hemoglobin: 12.3 g/dL (ref 12.0–15.0)
LYMPHS ABS: 2.8 10*3/uL (ref 0.7–4.0)
Lymphocytes Relative: 61 %
MCH: 28.1 pg (ref 26.0–34.0)
MCHC: 34 g/dL (ref 30.0–36.0)
MCV: 82.6 fL (ref 78.0–100.0)
MONOS PCT: 5 %
Monocytes Absolute: 0.2 10*3/uL (ref 0.1–1.0)
NEUTROS ABS: 1.6 10*3/uL — AB (ref 1.7–7.7)
NEUTROS PCT: 33 %
Platelets: 242 10*3/uL (ref 150–400)
RBC: 4.38 MIL/uL (ref 3.87–5.11)
RDW: 12.5 % (ref 11.5–15.5)
WBC: 4.7 10*3/uL (ref 4.0–10.5)

## 2015-02-22 LAB — I-STAT BETA HCG BLOOD, ED (MC, WL, AP ONLY): I-stat hCG, quantitative: <5 m[IU]/mL (ref <5)

## 2015-02-22 NOTE — ED Notes (Signed)
Pt from home for eval of vaginal bleeding x1 month and odor, pt states passing blood clots and using 4 super pads in 2 hours. Denies any n/v/d or fevers. Pt also reports abd cramping,s tates has been seen for same and told stress causes increase in vaginal discharge.

## 2015-02-23 ENCOUNTER — Emergency Department (HOSPITAL_COMMUNITY)
Admission: EM | Admit: 2015-02-23 | Discharge: 2015-02-23 | Disposition: A | Discharge status: Home / Self Care | Payer: Medicaid Other | Attending: Emergency Medicine | Admitting: Emergency Medicine

## 2015-02-23 ENCOUNTER — Ambulatory Visit: Payer: Medicaid Other | Admitting: Physical Therapy

## 2015-02-23 DIAGNOSIS — M25661 Stiffness of right knee, not elsewhere classified: Secondary | ICD-10-CM

## 2015-02-23 DIAGNOSIS — R600 Localized edema: Secondary | ICD-10-CM

## 2015-02-23 DIAGNOSIS — M25561 Pain in right knee: Secondary | ICD-10-CM

## 2015-02-23 DIAGNOSIS — R269 Unspecified abnormalities of gait and mobility: Secondary | ICD-10-CM

## 2015-02-23 DIAGNOSIS — R29898 Other symptoms and signs involving the musculoskeletal system: Secondary | ICD-10-CM

## 2015-02-23 NOTE — ED Notes (Signed)
Called for room no answer

## 2015-02-23 NOTE — Therapy (Signed)
[place], [place], [address] Phone: [phone]   Fax:  [phone]  Physical Therapy Treatment  Patient Details  Name: Amy Neal MRN: [id number] Date of Birth: 1995-05-10 Referring Provider: Frederik Pear MD  Encounter Date: 02/23/2015      PT End of Session - 02/23/15 1611    Visit Number 13   Number of Visits 19   Date for PT Re-Evaluation 03/21/15   Authorization Type Medicaid   Authorization Time Period 02/07/25- 03/21/15    Authorization - Number of Visits 6   PT Start Time 1550   PT Stop Time 1630   PT Time Calculation (min) 40 min   Activity Tolerance Patient tolerated treatment well   Behavior During Therapy [facility] for tasks assessed/performed      Past Medical History  Diagnosis Date  . Right ACL tear 11/2014  . Medial meniscus tear 11/2014    right knee  . Dental crowns present     also dental caps    Past Surgical History  Procedure Laterality Date  . Wisdom tooth extraction    . Knee arthroscopy with anterior cruciate ligament (acl) repair Right 11/23/2014    Procedure: KNEE ARTHROSCOPY WITH ANTERIOR CRUCIATE LIGAMENT (ACL) REPAIR PARTIAL INTERIOR MEDIAL MENISCECTOMY;  Surgeon: Frederik Pear, MD;  Location: [facility];  Service: Orthopedics;  Laterality: Right;  . Knee arthroscopy with medial menisectomy Left 11/23/2014    Procedure: KNEE ARTHROSCOPY WITH MEDIAL MENISECTOMY;  Surgeon: Frederik Pear, MD;  Location: [facility];  Service: Orthopedics;  Laterality: Left;    There were no vitals filed for this visit.  Visit Diagnosis:  Right knee pain  Weakness of right leg  Decreased ROM of right knee  Abnormality of gait  Localized edema      Subjective Assessment - 02/23/15 1554    Subjective No pain today, mild stiffness only when i'm up on it too much. The only thing I have trouble with is stairs (going down)   Currently in Pain? No/denies             [facility] PT Assessment - 02/23/15 1834    AROM   Right Knee Extension 4   Right Knee Flexion 126  painful with overpressure   Strength   Right Knee Flexion 4+/5   Right Knee Extension 5/5                     [facility] Adult PT Treatment/Exercise - 02/23/15 1557    Self-Care   Self-Care Scar Mobilizations;Other Self-Care Comments   Scar Mobilizations with cocoa butter, to improve mobility, appearance   Other Self-Care Comments  HEP more advanced    Knee/Hip Exercises: Aerobic   Elliptical level 5, ramp 9 for 5 min to warm up, less stiffness after   Knee/Hip Exercises: Machines for Strengthening   Cybex Knee Extension 1 set x 10, 20lbs , 1 set Rt. LE eccentric only   able to lower with 10lbs (control knee ext)   Cybex Knee Flexion 1 set RT. LE only 20 lbs   25 lbs 1 set x 10    Knee/Hip Exercises: Standing   Other Standing Knee Exercises lateral hip abd walk blue band 2 x 25 feet    Other Standing Knee Exercises hip abd blue band standing   x 20 each       single leg semicircles with towel on floor x 1 min each  Sliding backward lunge  with towel x 20 for Rt. Knee (quad)            PT Education - 02/23/15 1625    Education provided Yes   Education Details stairs, new updated HEP and light jogging , scar tissue massage    Person(s) Educated Patient   Methods Explanation;Demonstration   Comprehension Verbalized understanding;Returned demonstration          PT Short Term Goals - 01/29/15 1122    PT SHORT TERM GOAL #1   Title pt will be I with initial HEP (01/07/2014)   Status Achieved   PT SHORT TERM GOAL #2   Title pt will increase her R Knee to 0 - 105 with </= 4/10 pain to promote functional progression (01/07/2014)   Status Achieved   PT SHORT TERM GOAL #3   Title pt will be able to elicit a full quadricep contraction to work towards safety with weight bearing activities (01/07/2014)   Status Achieved   PT SHORT TERM GOAL #4   Title pt will be  able to verbalize and demonstrate techniques to control R knee pain and inflammation via RICE method (01/07/2014)   Status Achieved   PT SHORT TERM GOAL #5   Title pt will be able to walk/ standing for >/= 10 minutes with </= 4/10 pain to promote standing and walking endurance(01/07/2014)   Status Achieved           PT Long Term Goals - 02/23/15 1610    PT LONG TERM GOAL #1   Title pt will be I with all HEP at discharge (02/02/2015)   Status On-going   PT LONG TERM GOAL #2   Title pt will dmonstrate improved R knee AROM flexion to >/=120 and extension 0 with </=2/10 pain to assist with functional and efficient gait pattern (02/02/2015)   Baseline Pain >2/10 with AROM 126 deg   Status Partially Met   PT LONG TERM GOAL #3   Title pt will demonstrate R knee strength to >/=4/5 strength to promote safety with prolonged standing / walking activities associated with school requirements (02/02/2015)   Status Achieved   PT LONG TERM GOAL #4   Status Achieved   PT LONG TERM GOAL #5   Title pt will improve her FOTO score to >/= 55 to demonstrate improved function at discharge (02/02/2015)   Status Achieved               Plan - 02/23/15 1632    Clinical Impression Statement Amy Neal is doing well, she admits being less complaint with HEP for the past week or so. She cont with poor eccentric control of Rt. knee. She has pain with full AROM of Rt. knee, lack of control/strength in hips, core.  Will go back up to 2 times a week from remaining POC to maximize function.    PT Frequency 2x / week  incr to 2x due to less compliance with HEP   PT Duration 3 weeks   PT Next Visit Plan closed chain, quad control, SLS activites-review 4 way hip,  stretch hamstrings, try Reformer?   PT Home Exercise Plan continue, added lateral band walks, lunge for more dynamic strength   Consulted and Agree with Plan of Care Patient        Problem List There are no active problems to display for this  patient.   Waseem Suess 02/23/2015, 6:45 PM  [place], [place], [address] Phone: [phone]  Fax:  [phone]  Name: Amy Neal MRN: [id number] Date of Birth: [birth year]    Raeford Razor, PT 02/23/2015 6:45 PM Phone: [phone] Fax: [phone]

## 2015-02-25 ENCOUNTER — Encounter (HOSPITAL_COMMUNITY): Payer: Self-pay | Admitting: Emergency Medicine

## 2015-02-25 ENCOUNTER — Emergency Department (HOSPITAL_COMMUNITY)
Admission: EM | Admit: 2015-02-25 | Discharge: 2015-02-25 | Disposition: A | Discharge status: Home / Self Care | Payer: Medicaid Other | Attending: Emergency Medicine | Admitting: Emergency Medicine

## 2015-02-25 DIAGNOSIS — N921 Excessive and frequent menstruation with irregular cycle: Secondary | ICD-10-CM

## 2015-02-25 DIAGNOSIS — N920 Excessive and frequent menstruation with regular cycle: Secondary | ICD-10-CM | POA: Diagnosis not present

## 2015-02-25 DIAGNOSIS — N938 Other specified abnormal uterine and vaginal bleeding: Secondary | ICD-10-CM | POA: Diagnosis present

## 2015-02-25 DIAGNOSIS — R42 Dizziness and giddiness: Secondary | ICD-10-CM | POA: Diagnosis not present

## 2015-02-25 DIAGNOSIS — Z3202 Encounter for pregnancy test, result negative: Secondary | ICD-10-CM | POA: Diagnosis not present

## 2015-02-25 LAB — COMPREHENSIVE METABOLIC PANEL
ALBUMIN: 4 g/dL (ref 3.5–5.0)
ALK PHOS: 57 U/L (ref 38–126)
ALT: 13 U/L — AB (ref 14–54)
AST: 21 U/L (ref 15–41)
Anion gap: 11 (ref 5–15)
BUN: 9 mg/dL (ref 6–20)
CO2: 27 mmol/L (ref 22–32)
CREATININE: 0.8 mg/dL (ref 0.44–1.00)
Calcium: 9.6 mg/dL (ref 8.9–10.3)
Chloride: 105 mmol/L (ref 101–111)
GFR calc Af Amer: >60 mL/min (ref >60)
GLUCOSE: 72 mg/dL (ref 65–99)
Potassium: 3.9 mmol/L (ref 3.5–5.1)
Sodium: 143 mmol/L (ref 135–145)
TOTAL PROTEIN: 7.4 g/dL (ref 6.5–8.1)

## 2015-02-25 LAB — URINE MICROSCOPIC-ADD ON

## 2015-02-25 LAB — CBC WITH DIFFERENTIAL/PLATELET
Basophils Absolute: 0 10*3/uL (ref 0.0–0.1)
Basophils Relative: 0 %
EOS ABS: 0.1 10*3/uL (ref 0.0–0.7)
Eosinophils Relative: 2 %
HEMATOCRIT: 35.6 % — AB (ref 36.0–46.0)
HEMOGLOBIN: 11.6 g/dL — AB (ref 12.0–15.0)
LYMPHS ABS: 2 10*3/uL (ref 0.7–4.0)
Lymphocytes Relative: 40 %
MCH: 27.1 pg (ref 26.0–34.0)
MCHC: 32.6 g/dL (ref 30.0–36.0)
MCV: 83.2 fL (ref 78.0–100.0)
MONO ABS: 0.2 10*3/uL (ref 0.1–1.0)
MONOS PCT: 4 %
NEUTROS ABS: 2.7 10*3/uL (ref 1.7–7.7)
NEUTROS PCT: 54 %
Platelets: 213 10*3/uL (ref 150–400)
RBC: 4.28 MIL/uL (ref 3.87–5.11)
RDW: 12.6 % (ref 11.5–15.5)
WBC: 5 10*3/uL (ref 4.0–10.5)

## 2015-02-25 LAB — WET PREP, GENITAL
CLUE CELLS WET PREP: NONE SEEN
Sperm: NONE SEEN
TRICH WET PREP: NONE SEEN
YEAST WET PREP: NONE SEEN

## 2015-02-25 LAB — URINALYSIS, ROUTINE W REFLEX MICROSCOPIC
BILIRUBIN URINE: NEGATIVE
GLUCOSE, UA: NEGATIVE mg/dL
Ketones, ur: NEGATIVE mg/dL
Leukocytes, UA: NEGATIVE
Nitrite: NEGATIVE
PROTEIN: NEGATIVE mg/dL
Specific Gravity, Urine: 1.03 (ref 1.005–1.030)
pH: 6 (ref 5.0–8.0)

## 2015-02-25 LAB — SAMPLE TO BLOOD BANK

## 2015-02-25 LAB — I-STAT BETA HCG BLOOD, ED (MC, WL, AP ONLY)

## 2015-02-25 MED ORDER — SODIUM CHLORIDE 0.9 % IV BOLUS (SEPSIS)
1000.0000 mL | Freq: Once | INTRAVENOUS | Status: AC
  Administered 2015-02-25: 1000 mL via INTRAVENOUS

## 2015-02-25 NOTE — ED Provider Notes (Signed)
CSN: [id number]     Arrival date & time 02/25/15  0401 History   First MD Initiated Contact with Patient 02/25/15 0601     Chief Complaint  Patient presents with  . Back Pain  . Abdominal Pain  . Vaginal Bleeding     (Consider location/radiation/quality/duration/timing/severity/associated sxs/prior Treatment) HPI   Patient is a 20 year old female with no pertinent past medical history who presents to the ED with complaint of vaginal bleeding, onset 1 month. Patient reports that she has been having her period since the middle of January. She reports having worsening/heavier bleeding and notes she has been passing large clots. She notes her previous started in early December and also lasted for 3-4 weeks. She notes she has not seen an OB/GYN or other physician regarding this problem. Patient also endorses having intermittent aching  aower abdominal pain and upper back pain that has resolved since arrival to the ED. She endorses having associated nausea and lightheadedness with standing. Denies fever, chills, headache, shortness of breath, chest pain, cough, vomiting, diarrhea, urinary symptoms, vaginal discharge, numbness, tingling, weakness. Patient denies taking any medications prior to arrival. Denies history of abdominal surgeries. G0P0A0.   Past Medical History  Diagnosis Date  . Right ACL tear 11/2014  . Medial meniscus tear 11/2014    right knee  . Dental crowns present     also dental caps   Past Surgical History  Procedure Laterality Date  . Wisdom tooth extraction    . Knee arthroscopy with anterior cruciate ligament (acl) repair Right 11/23/2014    Procedure: KNEE ARTHROSCOPY WITH ANTERIOR CRUCIATE LIGAMENT (ACL) REPAIR PARTIAL INTERIOR MEDIAL MENISCECTOMY;  Surgeon: Gean Birchwood, MD;  Location: [facility];  Service: Orthopedics;  Laterality: Right;  . Knee arthroscopy with medial menisectomy Left 11/23/2014    Procedure: KNEE ARTHROSCOPY WITH MEDIAL MENISECTOMY;   Surgeon: Gean Birchwood, MD;  Location: [facility];  Service: Orthopedics;  Laterality: Left;   No family history on file. Social History  Substance Use Topics  . Smoking status: Never Smoker   . Smokeless tobacco: Never Used  . Alcohol Use: No   OB History    No data available     Review of Systems  Gastrointestinal: Positive for nausea and abdominal pain.  Genitourinary: Positive for vaginal bleeding.  Musculoskeletal: Positive for back pain.  Neurological: Positive for light-headedness.  All other systems reviewed and are negative.     Allergies  Shrimp  Home Medications   Prior to Admission medications   Medication Sig Start Date End Date Taking? Authorizing Provider  oxyCODONE-acetaminophen (ROXICET) 5-325 MG tablet Take 1 tablet by mouth every 4 (four) hours as needed. Patient not taking: Reported on 02/25/2015 11/23/14   Allena Katz, PA-C   BP 116/84 mmHg  Pulse 61  Temp(Src) 97.5 F (36.4 C) (Oral)  Resp 16  Ht  (1.626 m)  Wt 66.679 kg  BMI 25.22 kg/m2  SpO2 100%  LMP 01/03/2015 (Approximate) Physical Exam  Constitutional: She is oriented to person, place, and time. She appears well-developed and well-nourished. No distress.  HENT:  Head: Normocephalic and atraumatic.  Mouth/Throat: Oropharynx is clear and moist. No oropharyngeal exudate.  Eyes: Conjunctivae and EOM are normal. Right eye exhibits no discharge. Left eye exhibits no discharge. No scleral icterus.  Neck: Normal range of motion. Neck supple.  Cardiovascular: Normal rate, regular rhythm, normal heart sounds and intact distal pulses.   Pulmonary/Chest: Effort normal and breath sounds normal. No respiratory distress.  She has no wheezes. She has no rales. She exhibits no tenderness.  Abdominal: Soft. Bowel sounds are normal. She exhibits no distension and no mass. There is no tenderness. There is no rigidity, no rebound, no guarding and no CVA tenderness.  Musculoskeletal:  Normal range of motion. She exhibits no edema.  No C/T/L midline tenderness. FROM of neck and back. FROM of BUE and BLE with 5/5 strength. Sensation intact. 2+ radial and PT pulses. Cap refill <2.   Lymphadenopathy:    She has no cervical adenopathy.  Neurological: She is alert and oriented to person, place, and time.  Skin: Skin is warm and dry. She is not diaphoretic.  Nursing note and vitals reviewed.  Pelvic exam: normal external genitalia, vulva, vagina, cervix, uterus and adnexa, VULVA: normal appearing vulva with no masses, tenderness or lesions, VAGINA: normal appearing vagina with normal color and discharge, no lesions, WET MOUNT done - results: white blood cells, DNA probe for chlamydia and GC obtained, small amount of blood noted in vaginal vault, CERVIX: normal appearing cervix without discharge or lesions, UTERUS: uterus is normal size, shape, consistency and nontender, ADNEXA: normal adnexa in size, nontender and no masses, exam chaperoned by female tech.   ED Course  Procedures (including critical care time) Labs Review Labs Reviewed  WET PREP, GENITAL - Abnormal; Notable for the following:    WBC, Wet Prep HPF POC MODERATE (*)    All other components within normal limits  COMPREHENSIVE METABOLIC PANEL - Abnormal; Notable for the following:    ALT 13 (*)    Total Bilirubin <0.1 (*)    All other components within normal limits  URINALYSIS, ROUTINE W REFLEX MICROSCOPIC (NOT AT [facility]) - Abnormal; Notable for the following:    Hgb urine dipstick LARGE (*)    All other components within normal limits  CBC WITH DIFFERENTIAL/PLATELET - Abnormal; Notable for the following:    Hemoglobin 11.6 (*)    HCT 35.6 (*)    All other components within normal limits  URINE MICROSCOPIC-ADD ON - Abnormal; Notable for the following:    Squamous Epithelial / LPF 0-5 (*)    Bacteria, UA RARE (*)    All other components within normal limits  I-STAT BETA HCG BLOOD, ED ([facility], [facility], [facility] ONLY)  SAMPLE  TO BLOOD BANK  GC/CHLAMYDIA PROBE AMP ([place]) NOT AT [facility]    Imaging Review No results found. I have personally reviewed and evaluated these images and lab results as part of my medical decision-making.  Filed Vitals:   02/25/15 0427 02/25/15 0745  BP: 115/76 116/84  Pulse: 73 61  Temp: 97.5 F (36.4 C)   Resp: 16      MDM   Final diagnoses:  Menorrhagia with irregular cycle    Patient presents with vaginal bleeding for the past month and intermittent abdominal pain and back pain which she notes has resolved since arrival to the ED. LMP early December which also lasted appx. 3 weeks. No PMH. No back pain red flags. G0P0A0. VSS. Exam unremarkable. Pelvic exam revealed small amount of blood in vaginal vault, no CMT, no adnexal tenderness.  Patient given IV fluids. Hemoglobin 11.6. Pregnancy negative. Remaining labs unremarkable. Patient has remained hemodynamically stable while in the ED.  9:00 - on reevaluation patient reports she is currently not in any pain and is feeling better. She notes her bleeding has improved since arrival to the ED. Pt able to stand and ambulate without assistance, no reported sxs. Discussed results  and plan for discharge with patient. Patient denies having an OB/GYN. Plan to discharge patient home with symptomatic treatment and patient given OB/GYN follow-up information/instructions.  Evaluation does not show pathology requring ongoing emergent intervention or admission. Pt is hemodynamically stable and mentating appropriately. Discussed findings/results and plan with patient/guardian, who agrees with plan. All questions answered. Return precautions discussed and outpatient follow up given.     Satira Sark [place], [place] 02/25/15 1610  Azalia Bilis, MD 02/26/15 [phone]

## 2015-02-25 NOTE — ED Notes (Signed)
Patient states that she has been having her period since middle of January.  Patient states that the abdominal pain has gotten worse, she is passing large clots, her flow has been getting heavier.

## 2015-02-25 NOTE — Discharge Instructions (Signed)
You may take Tylenol or ibuprofen as prescribed over-the-counter as needed for pain relief. I recommend continuing to drink fluids at home to remain hydrated. Call the OB/GYN clinic listed above to schedule a follow-up appointment. Please return to the Emergency Department if symptoms worsen or new onset of fever, abdominal pain, vomiting, diarrhea, worsening vaginal bleeding, lightheadedness, dizziness, syncope.   Emergency Department Resource Guide 1) Find a Doctor and Pay Out of Pocket Although you won't have to find out who is covered by your insurance plan, it is a good idea to ask around and get recommendations. You will then need to call the office and see if the doctor you have chosen will accept you as a new patient and what types of options they offer for patients who are self-pay. Some doctors offer discounts or will set up payment plans for their patients who do not have insurance, but you will need to ask so you aren't surprised when you get to your appointment.  2) Contact Your Local Health Department Not all health departments have doctors that can see patients for sick visits, but many do, so it is worth a call to see if yours does. If you don't know where your local health department is, you can check in your phone book. The CDC also has a tool to help you locate your state's health department, and many state websites also have listings of all of their local health departments.  3) Find a Walk-in Clinic If your illness is not likely to be very severe or complicated, you may want to try a walk in clinic. These are popping up all over the country in pharmacies, drugstores, and shopping centers. They're usually staffed by nurse practitioners or physician assistants that have been trained to treat common illnesses and complaints. They're usually fairly quick and inexpensive. However, if you have serious medical issues or chronic medical problems, these are probably not your best  option.  No Primary Care Doctor: - Call Health Connect at  [phone] - they can help you locate a primary care doctor that  accepts your insurance, provides certain services, etc. - Physician Referral Service- [phone]  Chronic Pain Problems: Organization         Address  Phone   Notes  [facility]  [phone] Patients need to be referred by their primary care doctor.   Medication Assistance: Organization         Address  Phone   Notes  [facility] [address] [place], [place] [address] [phone] --Must be a resident of [facility] -- Must have NO insurance coverage whatsoever (no Medicaid/ Medicare, etc.) -- The pt. MUST have a primary care doctor that directs their care regularly and follows them in the community   [facility]  [phone]   [employer]  [phone]    Agencies that provide inexpensive medical care: Organization         Address  Phone   Notes  [facility]  [phone]   [facility]    [phone]   [facility] [address] [place], [place] [address] [phone]   [facility] [address] [place] [place] [phone]   Planned Parenthood    [phone]   [facility]    [phone]   [facility]  [address] [place], [place] Phone:  [phone])  [phone], Fax:  [phone] Hours of Operation:  9 am - 6 pm, M-F.  Also accepts Medicaid/Medicare and self-pay.  [facility]  [address], [place] Phone: [phone], Fax: [phone]. Hours of Operation:  8:30 am - 5:30 pm, M-F.  Also accepts Medicaid and self-pay.  [facility] [place] [address] [place] Phone: [phone]   [facility] [address] Natasha Bence [place], [place] [phone], Ext. [phone] Mondays & Thursdays: 7-9 AM.  First 15  patients are seen on a first come, first serve basis.    Medicaid-accepting [facility] Providers:  Organization         Address  Phone   Notes  [facility] [address],  [phone] Also accepts self-pay patients.  [facility] [address] Laurell Josephs [place], [place]  [phone]   [facility] [address], [place] [phone]   [facility] [address] [place] [phone]   Renaye Rakers [address], [place]   [phone] Only accepts [place] Access [place] patients after they have their name applied to their card.   Self-Pay (no insurance) in [facility]:  Organization         Address  Phone   Notes  Sickle Cell Patients, [facility] [address] [place], [place] [phone]   [facility] [address] [place], [place] [phone]   [facility]  [address], [place] [phone]   [facility]/Dr. Osei-Bonsu  [address] [place] or [address], [place] [phone] Phone number for both [place] and [place] locations is the same.  [facility] [address] [place] [phone]   [facility] [address] [place] or [address] [phone]   [facility] [address] [place] [phone], phone; [phone], fax Sees patients 1st and 3rd Saturday of every month.  Must not qualify for public or private insurance (i.e. Medicaid, Medicare, [place] Health Choice, Veterans' Benefits)  Household income should be no more than 200% of the poverty level The clinic cannot treat you if you are pregnant or think you are pregnant  Sexually transmitted diseases are not treated at the clinic.    Dental  Care: Organization         Address  Phone  Notes  [facility] Department of [facility] [address] [place], [place] [phone] Accepts children up to age 19 who are enrolled in [place] or [place] Health Choice; pregnant women with a Medicaid card; and children who have applied for Medicaid or [place] Health Choice, but were declined, whose parents can pay a reduced fee at time of service.  [facility]  [address], [place] [phone] Accepts children up to age 83 who are enrolled in [place] or [place] Health Choice; pregnant women with a Medicaid card; and children who have applied for Medicaid or  Health Choice, but were declined, whose parents can pay a reduced fee at time of service.  [facility]  [address] [place], [place] [phone] Patients are seen by appointment only. Walk-ins are  not accepted. [facility] will see patients 18 years of age and older. °Monday - Tuesday (8am-5pm) °Most Wednesdays (8:30-5pm) °$30 per visit, cash only  °[facility] ° [address], [place] [phone] Patients are seen by appointment only. Walk-ins are not accepted. [facility] will see patients 18 years of age and older. °One Wednesday Evening (Monthly: Volunteer Based).  $30 per visit, cash only  °[facility]  [phone] for adults; Children under age 4, call Graduate Pediatric Dentistry at [phone]. Children aged 4-14, please call [phone] to request a pediatric application. ° Dental services are provided in all areas of dental care including fillings, crowns and bridges, complete and partial dentures, implants, gum treatment, root canals, and extractions. Preventive care is also provided. Treatment is provided to both adults and children. °Patients are selected via a lottery and there is often a waiting list. °  °[facility] [address], °[place] ° ([phone] www.drcivils.com °  °[facility] [address], [place], [place] ([phone], Ext. [phone] Second and Fourth Thursday of each month, opens at 6:30 AM; Clinic ends at 9 AM.  Patients are seen on a first-come first-served basis, and a limited number are seen during each clinic.  ° °[facility] ° [address], [place], [place] [phone]   Eligibility Requirements °You must have lived in [place], [place], or [place] counties for at least the last three months. °  You cannot be eligible for state or federal sponsored healthcare insurance, including Veterans Administration, Medicaid, or Medicare. °  You generally cannot be eligible for healthcare insurance through your employer.  °  How to apply: °Eligibility screenings are held every Tuesday and Wednesday afternoon from 1:00 pm until 4:00 pm. You do not need an appointment for the interview!  °[facility] [address], [place], [place] [phone]   °[facility]  [phone]   °[facility]  [phone]   °[facility]  336-570-6415   ° °Behavioral Health Resources in the Community: °Intensive Outpatient Programs °Organization         Address  Phone  Notes  °[facility] [address], [place], [place] [phone]   °[facility] [address], [place], [place] [phone]   °[facility] [address], [place], [place] ° [phone]   °[facility] [address],  °[place], [place] 1-800-853-5163 or 336-641-4981   °Substance Abuse Resources °Organization         Address  Phone  Notes  °[facility]  336-882-2125   °[facility]  336-784-9470   °[facility]  336-285-9073   °[facility]  336-845-3988   °Residential & Outpatient Substance Abuse Program  1-800-659-3381    °Psychological Services °Organization         Address  Phone  Notes  °[facility]  336- [phone]   °[facility]  336- 378-7881   °[facility] [address], [place] 1-800-853-5163 or 336-641-4981   ° °Mobile Crisis Teams °Organization         Address  Phone  Notes  °[facility], Mobile Crisis Care Unit  1-877-626-1772   °Assertive °[facility]otherapeutic Services ° [address] [place], [place] 336-834-9664   °Sharon DeEsch [address] ° [place] 336-554-5454   ° °Self-Help/Support   Groups Organization         Address  Phone             Notes  [facility] - variety of support groups  336- I7437963 Call for more information  Narcotics Anonymous (NA), [facility] [address], [employer] [place]  2 meetings at this location   Statistician         Address  Phone  Notes  [facility] [address],    [place]  [phone]   [facility]  [address] [place] [address], [place], [place] [phone]   [facility] [address] [place], [place] [phone] Admissions: 8am-3pm M-F  [facility] [address],    [place], [place] [phone]   [facility] [address] [place], [place], [place] [phone]   [facility] [address]  [place], [place] [phone]   [facility] - Intensive Outpatient [address], [place], [place] [phone]   [facility] ([facility] [address] [place]  [place], [place] 1-884-166-0630 or [phone]   [facility] ([facility]) [address], [place], [place] [phone] Accepts Medicaid  [facility] [place] [address]  [employer] [place] [phone] Substance Abuse/Addiction Treatment   [facility] Organization         Address  Phone  Notes  [facility]  [phone]   Angie Fava, PhD [address] Ervin Knack [place], [place]   (518)[phone] or (332)[phone]   [facility]   [address] [place], [place] [phone]   [facility] [address], [place], [place] [phone] Insurance/Medicaid/sponsorship through [facility] [address]                                    [place], [place] [phone] Therapy/tele-psych/case  [facility] [address], [place] [phone]    Dr. Lolly Mustache  [phone]   [facility]  [facility] Xxx Dept. 1) [address] [place] 2) [address] [place] 3)  [address], [place] [phone]  [phone]   [facility] Child Abuse Hotline (747)607-9558 or [phone] (After Hours)

## 2015-02-25 NOTE — ED Notes (Signed)
Pt. reports upper back pain , low abdominal pain , mild nausea and vaginal bleeding for 2 months with clots .

## 2015-02-26 LAB — GC/CHLAMYDIA PROBE AMP (~~LOC~~) NOT AT ARMC
Chlamydia: NEGATIVE
Neisseria Gonorrhea: NEGATIVE

## 2015-03-05 ENCOUNTER — Ambulatory Visit: Payer: Medicaid Other | Attending: Orthopedic Surgery | Admitting: Physical Therapy

## 2015-03-05 DIAGNOSIS — M25561 Pain in right knee: Secondary | ICD-10-CM | POA: Insufficient documentation

## 2015-03-05 DIAGNOSIS — R29898 Other symptoms and signs involving the musculoskeletal system: Secondary | ICD-10-CM | POA: Insufficient documentation

## 2015-03-05 DIAGNOSIS — R600 Localized edema: Secondary | ICD-10-CM | POA: Insufficient documentation

## 2015-03-05 DIAGNOSIS — R269 Unspecified abnormalities of gait and mobility: Secondary | ICD-10-CM | POA: Insufficient documentation

## 2015-03-09 ENCOUNTER — Ambulatory Visit: Payer: Medicaid Other | Admitting: Physical Therapy

## 2015-03-11 ENCOUNTER — Encounter: Payer: Medicaid Other | Admitting: Physical Therapy

## 2015-03-15 ENCOUNTER — Encounter: Payer: Medicaid Other | Admitting: Obstetrics & Gynecology

## 2015-03-16 ENCOUNTER — Ambulatory Visit: Payer: Medicaid Other | Admitting: Physical Therapy

## 2015-03-16 DIAGNOSIS — M25561 Pain in right knee: Secondary | ICD-10-CM

## 2015-03-16 DIAGNOSIS — R29898 Other symptoms and signs involving the musculoskeletal system: Secondary | ICD-10-CM

## 2015-03-16 DIAGNOSIS — R269 Unspecified abnormalities of gait and mobility: Secondary | ICD-10-CM

## 2015-03-16 DIAGNOSIS — R600 Localized edema: Secondary | ICD-10-CM

## 2015-03-16 DIAGNOSIS — M25661 Stiffness of right knee, not elsewhere classified: Secondary | ICD-10-CM

## 2015-03-16 NOTE — Therapy (Signed)
[place], [place], [address] Phone: [phone]   Fax:  [phone]  Physical Therapy Treatment  Patient Details  Name: Amy Neal MRN: [id number] Date of Birth: [birth year] Referring Provider: Frederik Pear MD  Encounter Date: 03/16/2015      PT End of Session - 03/16/15 1616    Visit Number 14   Number of Visits 19   Date for PT Re-Evaluation 03/21/15   PT Start Time 1548   PT Stop Time 1630   PT Time Calculation (min) 42 min   Activity Tolerance Patient tolerated treatment well   Behavior During Therapy [facility] for tasks assessed/performed      Past Medical History  Diagnosis Date  . Right ACL tear 11/2014  . Medial meniscus tear 11/2014    right knee  . Dental crowns present     also dental caps    Past Surgical History  Procedure Laterality Date  . Wisdom tooth extraction    . Knee arthroscopy with anterior cruciate ligament (acl) repair Right 11/23/2014    Procedure: KNEE ARTHROSCOPY WITH ANTERIOR CRUCIATE LIGAMENT (ACL) REPAIR PARTIAL INTERIOR MEDIAL MENISCECTOMY;  Surgeon: Frederik Pear, MD;  Location: [place];  Service: Orthopedics;  Laterality: Right;  . Knee arthroscopy with medial menisectomy Left 11/23/2014    Procedure: KNEE ARTHROSCOPY WITH MEDIAL MENISECTOMY;  Surgeon: Frederik Pear, MD;  Location: [place];  Service: Orthopedics;  Laterality: Left;    There were no vitals filed for this visit.  Visit Diagnosis:  Right knee pain  Weakness of right leg  Decreased ROM of right knee  Abnormality of gait  Localized edema      Subjective Assessment - 03/16/15 1601    Subjective No pain, it's popping more, if i sit too long and then I stretch it. Stairs can be a struggle at times, its better though.             [facility] PT Assessment - 03/16/15 1623    AROM   Right Knee Extension 2   Right Knee Flexion 126   Strength   Right Knee Flexion  5/5   Right Knee Extension 5/5                     [facility] Adult PT Treatment/Exercise - 03/16/15 1610    Knee/Hip Exercises: Stretches   Active Hamstring Stretch 2 reps;30 seconds   Knee/Hip Exercises: Aerobic   Elliptical ramp 9, level 5, 5 min    Knee/Hip Exercises: Machines for Strengthening   Cybex Knee Extension 1x 10 at 15 lbs 2 x 10 , 25 lbs    Cybex Knee Flexion 1 set RT. LE only 20 lbs   25 lbs 1 set x 10    Knee/Hip Exercises: Standing   Functional Squat 1 set;15 reps   Other Standing Knee Exercises lateral hip abd walk blue band 2 x 25 feet    Other Standing Knee Exercises walking lunge 4 x 15       ice pack post 8 min           PT Education - 03/16/15 1614    Education provided Yes   Education Details HEP and DC   Person(s) Educated Patient   Methods Explanation;Demonstration   Comprehension Verbalized understanding;Returned demonstration          PT Short Term Goals - 03/16/15 1556    PT SHORT TERM GOAL #1   Title pt  will be I with initial HEP (01/07/2014)   Status Achieved   PT SHORT TERM GOAL #2   Title pt will increase her R Knee to 0 - 105 with </= 4/10 pain to promote functional progression (01/07/2014)   Status Achieved   PT SHORT TERM GOAL #3   Title pt will be able to elicit a full quadricep contraction to work towards safety with weight bearing activities (01/07/2014)   Status Achieved   PT SHORT TERM GOAL #4   Title pt will be able to verbalize and demonstrate techniques to control R knee pain and inflammation via RICE method (01/07/2014)   Status Achieved   PT SHORT TERM GOAL #5   Title pt will be able to walk/ standing for >/= 10 minutes with </= 4/10 pain to promote standing and walking endurance(01/07/2014)   Status Achieved           PT Long Term Goals - 03/16/15 1556    PT LONG TERM GOAL #1   Title pt will be I with all HEP at discharge (02/02/2015)   PT LONG TERM GOAL #2   Title pt will dmonstrate improved R knee AROM  flexion to >/=120 and extension 0 with </=2/10 pain to assist with functional and efficient gait pattern (02/02/2015)   Status Achieved   PT LONG TERM GOAL #3   Title pt will demonstrate R knee strength to >/=4/5 strength to promote safety with prolonged standing / walking activities associated with school requirements (02/02/2015)   Status Achieved   PT LONG TERM GOAL #4   Title pt will be able to walk/ stand for >/=20 minutes with </=2/10 pain to help with endurance with prolonged weight bearing activities ( 02/02/2015)   Status Achieved   PT LONG TERM GOAL #5   Title pt will improve her FOTO score to >/= 55 to demonstrate improved function at discharge (02/02/2015)   Status Achieved               Plan - 03/16/15 1616    Clinical Impression Statement Patient has met all LTGs and feels she can cont to work on her knee independently.     PT Next Visit Plan DC   PT Home Exercise Plan continue, added lateral band walks, lunge for more dynamic strength   Consulted and Agree with Plan of Care Patient       FOTO 30% limited, goal surpassed Problem List There are no active problems to display for this patient.   PAA,JENNIFER 03/16/2015, 4:28 PM  [facility] [address] [place], [place], [address] Phone: [phone]   Fax:  [phone]  Name: Amy Neal MRN: [id number] Date of Birth: 20-Sep-1995    PHYSICAL THERAPY DISCHARGE SUMMARY  Visits from Start of Care: 16  Current functional level related to goals / functional outcomes: See above for goals met    Remaining deficits: Tightness post Rt. Knee   Education / Equipment: HEP, RICE, posture, alignment Plan: Patient agrees to discharge.  Patient goals were met. Patient is being discharged due to meeting the stated rehab goals.  ?????   Raeford Razor, PT 03/16/2015 4:34 PM Phone: [phone] Fax: [phone]

## 2015-07-02 ENCOUNTER — Ambulatory Visit (INDEPENDENT_AMBULATORY_CARE_PROVIDER_SITE_OTHER): Payer: Self-pay | Admitting: Internal Medicine

## 2015-07-02 DIAGNOSIS — Z789 Other specified health status: Secondary | ICD-10-CM

## 2015-07-02 DIAGNOSIS — Z23 Encounter for immunization: Secondary | ICD-10-CM

## 2015-07-02 DIAGNOSIS — Z7189 Other specified counseling: Secondary | ICD-10-CM

## 2015-07-02 DIAGNOSIS — Z9189 Other specified personal risk factors, not elsewhere classified: Secondary | ICD-10-CM

## 2015-07-02 DIAGNOSIS — Z7184 Encounter for health counseling related to travel: Secondary | ICD-10-CM | POA: Insufficient documentation

## 2015-07-02 MED ORDER — TYPHOID VACCINE PO CPDR: 1.0000 | DELAYED_RELEASE_CAPSULE | ORAL | Status: DC

## 2015-07-02 MED ORDER — MEFLOQUINE HCL 250 MG PO TABS: 250.0000 mg | ORAL_TABLET | ORAL | Status: DC

## 2015-07-02 MED ORDER — CIPROFLOXACIN HCL 500 MG PO TABS: 500.0000 mg | ORAL_TABLET | Freq: Two times a day (BID) | ORAL | Status: DC

## 2015-07-02 NOTE — Progress Notes (Signed)
Subjective:   Terena A Bruna Pottereague is a 20 y.o. female who presents to the Infectious Disease clinic for travel consultation. 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 none     Plan:    Issues discussed: environmental concerns, future shots, malaria, MVA safety, rabies, safe food/water, traveler's diarrhea, website/handouts for more information, what to do if ill upon return, what to do if ill while there and Yellow Fever. Immunizations recommended: Hepatitis A series, Td, Typhoid (parenteral), Yellow Fever and at this time, she is going to defer Tdap and see if she has had it or can get it through campus, same with hepatitis A and think about meningitis booster with Menactra.. Malaria prophylaxis: mefloquine, weekly dose starting one week before entering endemic area, ending 4 weeks after leaving area Traveler's diarrhea prophylaxis: azithromycin. Total duration of visit: 1 Hour. Total time spent on education, counseling, coordination of care: 30 Minutes.

## 2015-07-02 NOTE — Addendum Note (Signed)
Addended by: Andree CossHOWELL, Carline Dura M on: 07/02/2015 02:17 PM   Modules accepted: Orders

## 2015-07-02 NOTE — Patient Instructions (Signed)
[facility]                [address], [address]                   [place], [place] [id number]                      Phone: [phone]                        Fax: [phone]   Planned departure date: August 2017          Planned return date: 5 months Countries of travel: [place]   Guidelines for the Prevention & Treatment of Traveler's Diarrhea  Prevention: "Boil it, Peel it, Adriana SimasCook it, or Forget it"   the fewer chances -> lower risk: try to stick to food & water precautions as much as possible"   If it's "piping hot"; it is probably okay, if not, it may not be   Treatment   1) You should always take care to drink lots of fluids in order to avoid dehydration   2) You should bring medications with you in case you come down with a case of diarrhea   3) OTC = bring pepto-bismol - can take with initial abdominal symptoms;                    Imodium - can help slow down your intestinal tract, can help relief cramps                    and diarrhea, can take if no bloody diarrhea  Use ciprofloxacin if needed for traveler's diarrhea  Guidelines for the Prevention of Malaria  Avoidance:  -fewer mosquito bites = lower risk. Mosquitos can bite at night as well as daytime  -cover up (long sleeve clothing), mosquito nets, screens  -Insect repellent for your skin ( DEET containing lotion > 20%): for clothes ( permethrin spray)   One week prior to leaving start mefloquine, weekly dose starting one week before entering endemic area, ending 4 weeks after leaving area for malaria prevention.   Immunizations received today: Hepatitis A series, Meningococcus, Td, Typhoid (oral) and Yellow Fever  Future immunizations, if indicated Hepatitis A series in 6 months   Prior to travel:  1) Be sure to pick up appropriate prescriptions, including medicine you take daily. Do not expect to be able to fill your prescriptions abroad.  2) Strongly consider  obtaining traveler's insurance, including emergency evacuation insurance. Most plans in the [place] do not cover participants abroad. (see below for resources)  3) Register at the appropriate [place] embassy or consulate with travel dates so they are aware of your presence in-country and for helpful advice during travel using the [employer] Traveler Enrollment Program (STEP, GuyGalaxy.sihttps://step.state.gov/step).  4) Leave contact information with a relative or friend.  5) Keep a Corporate treasurerphotocopy passport, credit cards in case they become lost or stolen  6) Inform your credit card company that you will be travelling abroad   During travel:  1) If you become ill and need medical advice, the U.S[place] Well[employer]embassy website of the country you are traveling in general provides a list of English speaking doctors.  We are also available on MyChart for remote consultation if you register prior to travel. 2) Avoid motorcycles or scooters when at all possible. Traffic laws in many  countries are lax and accidents occur frequently.  3) Do not take any unnecessary risks that you wouldn't do at home.   Resources:  -Country specific information: BlindResource.ca or GreenNylon.com.cy  -Press photographer (DEET, mosquito nets): [employer], [employer] store, [employer], [place] insurance options: gatewayplans.com; http://clayton-rivera.info/; travelguard.com or Good Pilgrim's Pride, gninsurance.com or info@gninsurance .com, 269-689-4941.   Post Travel:  If you return from your trip ill, call your primary care doctor or our travel clinic @ [phone].   Enjoy your trip and know that with proper pre-travel preparation, most people have an enjoyable and uninterrupted trip!

## 2016-01-26 ENCOUNTER — Inpatient Hospital Stay (HOSPITAL_COMMUNITY): Payer: Medicaid Other

## 2016-01-26 ENCOUNTER — Inpatient Hospital Stay (HOSPITAL_COMMUNITY)
Admission: AD | Admit: 2016-01-26 | Discharge: 2016-01-30 | DRG: 765 | Disposition: A | Discharge status: Home / Self Care | Payer: Medicaid Other | Source: Ambulatory Visit | Attending: Obstetrics & Gynecology | Admitting: Obstetrics & Gynecology

## 2016-01-26 ENCOUNTER — Encounter (HOSPITAL_COMMUNITY): Payer: Self-pay | Admitting: *Deleted

## 2016-01-26 DIAGNOSIS — O4102X0 Oligohydramnios, second trimester, not applicable or unspecified: Secondary | ICD-10-CM | POA: Diagnosis not present

## 2016-01-26 DIAGNOSIS — O368130 Decreased fetal movements, third trimester, not applicable or unspecified: Secondary | ICD-10-CM | POA: Diagnosis present

## 2016-01-26 DIAGNOSIS — O4102X1 Oligohydramnios, second trimester, fetus 1: Secondary | ICD-10-CM

## 2016-01-26 DIAGNOSIS — Z3A29 29 weeks gestation of pregnancy: Secondary | ICD-10-CM

## 2016-01-26 DIAGNOSIS — O4103X0 Oligohydramnios, third trimester, not applicable or unspecified: Secondary | ICD-10-CM | POA: Diagnosis present

## 2016-01-26 DIAGNOSIS — O368390 Maternal care for abnormalities of the fetal heart rate or rhythm, unspecified trimester, not applicable or unspecified: Secondary | ICD-10-CM | POA: Diagnosis present

## 2016-01-26 DIAGNOSIS — O321XX0 Maternal care for breech presentation, not applicable or unspecified: Secondary | ICD-10-CM | POA: Diagnosis present

## 2016-01-26 DIAGNOSIS — O0930 Supervision of pregnancy with insufficient antenatal care, unspecified trimester: Secondary | ICD-10-CM

## 2016-01-26 DIAGNOSIS — O365930 Maternal care for other known or suspected poor fetal growth, third trimester, not applicable or unspecified: Secondary | ICD-10-CM | POA: Diagnosis present

## 2016-01-26 LAB — AMNISURE RUPTURE OF MEMBRANE (ROM) NOT AT ARMC: AMNISURE: NEGATIVE

## 2016-01-26 LAB — RAPID HIV SCREEN (HIV 1/2 AB+AG)
HIV 1/2 Antibodies: NONREACTIVE
HIV-1 P24 Antigen - HIV24: NONREACTIVE

## 2016-01-26 LAB — DIFFERENTIAL
BASOS ABS: 0 10*3/uL (ref 0.0–0.1)
BASOS PCT: 0 %
Eosinophils Absolute: 0.1 10*3/uL (ref 0.0–0.7)
Eosinophils Relative: 1 %
LYMPHS PCT: 31 %
Lymphs Abs: 2.1 10*3/uL (ref 0.7–4.0)
MONO ABS: 0.3 10*3/uL (ref 0.1–1.0)
MONOS PCT: 5 %
NEUTROS ABS: 4.4 10*3/uL (ref 1.7–7.7)
Neutrophils Relative %: 63 %

## 2016-01-26 LAB — TYPE AND SCREEN
ABO/RH(D): AB POS
Antibody Screen: NEGATIVE

## 2016-01-26 LAB — CBC
HCT: 32.8 % — ABNORMAL LOW (ref 36.0–46.0)
HEMOGLOBIN: 11.3 g/dL — AB (ref 12.0–15.0)
MCH: 28.5 pg (ref 26.0–34.0)
MCHC: 34.5 g/dL (ref 30.0–36.0)
MCV: 82.6 fL (ref 78.0–100.0)
Platelets: 251 10*3/uL (ref 150–400)
RBC: 3.97 MIL/uL (ref 3.87–5.11)
RDW: 13.1 % (ref 11.5–15.5)
WBC: 7 10*3/uL (ref 4.0–10.5)

## 2016-01-26 LAB — URINALYSIS, ROUTINE W REFLEX MICROSCOPIC
BILIRUBIN URINE: NEGATIVE
Glucose, UA: NEGATIVE mg/dL
Hgb urine dipstick: NEGATIVE
Ketones, ur: NEGATIVE mg/dL
LEUKOCYTES UA: NEGATIVE
Nitrite: NEGATIVE
PH: 7 (ref 5.0–8.0)
Protein, ur: NEGATIVE mg/dL
SPECIFIC GRAVITY, URINE: 1.003 — AB (ref 1.005–1.030)

## 2016-01-26 LAB — APTT: APTT: 28 s (ref 24–36)

## 2016-01-26 LAB — PROTIME-INR
INR: 1.04
Prothrombin Time: 13.6 seconds (ref 11.4–15.2)

## 2016-01-26 LAB — HEPATITIS B SURFACE ANTIGEN: HEP B S AG: NEGATIVE

## 2016-01-26 LAB — ABO/RH: ABO/RH(D): AB POS

## 2016-01-26 LAB — FIBRINOGEN: FIBRINOGEN: 426 mg/dL (ref 210–475)

## 2016-01-26 MED ORDER — SOD CITRATE-CITRIC ACID 500-334 MG/5ML PO SOLN
ORAL | Status: AC
  Filled 2016-01-26: qty 15

## 2016-01-26 MED ORDER — CALCIUM CARBONATE ANTACID 500 MG PO CHEW: 2.0000 | CHEWABLE_TABLET | ORAL | Status: DC | PRN

## 2016-01-26 MED ORDER — MAGNESIUM SULFATE 50 % IJ SOLN
2.0000 g/h | INTRAVENOUS | Status: DC
  Filled 2016-01-26: qty 80

## 2016-01-26 MED ORDER — ACETAMINOPHEN 325 MG PO TABS: 650.0000 mg | ORAL_TABLET | ORAL | Status: DC | PRN

## 2016-01-26 MED ORDER — PRENATAL MULTIVITAMIN CH: 1.0000 | ORAL_TABLET | Freq: Every day | ORAL | Status: DC

## 2016-01-26 MED ORDER — LACTATED RINGERS IV SOLN
INTRAVENOUS | Status: DC
  Administered 2016-01-26 (×2): via INTRAVENOUS

## 2016-01-26 MED ORDER — BETAMETHASONE SOD PHOS & ACET 6 (3-3) MG/ML IJ SUSP: 12.0000 mg | Freq: Once | INTRAMUSCULAR | Status: DC

## 2016-01-26 MED ORDER — LACTATED RINGERS IV SOLN
500.0000 mL | INTRAVENOUS | Status: DC | PRN
  Administered 2016-01-26: 500 mL via INTRAVENOUS

## 2016-01-26 MED ORDER — ONDANSETRON HCL 4 MG/2ML IJ SOLN: 4.0000 mg | Freq: Four times a day (QID) | INTRAMUSCULAR | Status: DC | PRN

## 2016-01-26 MED ORDER — OXYCODONE-ACETAMINOPHEN 5-325 MG PO TABS: 1.0000 | ORAL_TABLET | ORAL | Status: DC | PRN

## 2016-01-26 MED ORDER — FLEET ENEMA 7-19 GM/118ML RE ENEM: 1.0000 | ENEMA | Freq: Every day | RECTAL | Status: DC | PRN

## 2016-01-26 MED ORDER — DOCUSATE SODIUM 100 MG PO CAPS: 100.0000 mg | ORAL_CAPSULE | Freq: Every day | ORAL | Status: DC

## 2016-01-26 MED ORDER — OXYTOCIN BOLUS FROM INFUSION: 500.0000 mL | Freq: Once | INTRAVENOUS | Status: DC

## 2016-01-26 MED ORDER — OXYTOCIN 40 UNITS IN LACTATED RINGERS INFUSION - SIMPLE MED: 2.5000 [IU]/h | INTRAVENOUS | Status: DC

## 2016-01-26 MED ORDER — CEFAZOLIN SODIUM-DEXTROSE 2-4 GM/100ML-% IV SOLN
2.0000 g | Freq: Three times a day (TID) | INTRAVENOUS | Status: DC
  Filled 2016-01-26 (×2): qty 100

## 2016-01-26 MED ORDER — SOD CITRATE-CITRIC ACID 500-334 MG/5ML PO SOLN: 30.0000 mL | ORAL | Status: DC | PRN

## 2016-01-26 MED ORDER — MAGNESIUM SULFATE BOLUS VIA INFUSION
4.0000 g | Freq: Once | INTRAVENOUS | Status: AC
  Administered 2016-01-26: 4 g via INTRAVENOUS
  Filled 2016-01-26: qty 500

## 2016-01-26 MED ORDER — ZOLPIDEM TARTRATE 5 MG PO TABS: 5.0000 mg | ORAL_TABLET | Freq: Every evening | ORAL | Status: DC | PRN

## 2016-01-26 MED ORDER — OXYCODONE-ACETAMINOPHEN 5-325 MG PO TABS: 2.0000 | ORAL_TABLET | ORAL | Status: DC | PRN

## 2016-01-26 MED ORDER — BETAMETHASONE SOD PHOS & ACET 6 (3-3) MG/ML IJ SUSP
12.0000 mg | Freq: Once | INTRAMUSCULAR | Status: AC
  Administered 2016-01-26: 12 mg via INTRAMUSCULAR
  Filled 2016-01-26: qty 2

## 2016-01-26 MED ORDER — LACTATED RINGERS IV SOLN: INTRAVENOUS | Status: DC

## 2016-01-26 MED ORDER — LIDOCAINE HCL (PF) 1 % IJ SOLN: 30.0000 mL | INTRAMUSCULAR | Status: DC | PRN

## 2016-01-26 NOTE — MAU Note (Signed)
Not feeling baby move as much as she would expect for being 6 months preg.   When questioned further- pt states has not felt any movement in the past week.  [place] the states Dec 15; was in [place]; received care while there, none since returned.  Has been having pain in abd and back, though none today. No bleeding or leaking. Has had watery diarrhea the last 3 days,once today

## 2016-01-26 NOTE — Consult Note (Signed)
Asked by Dr.Eure to provide prenatal consultation for 21 y.o. G1 blood type AB positive mother whose EGA is 8127 - 29 wks (uncertain due to lack of prenatal care).  She presented to MAU with severe intermittent abdominal pain and decreased fetal movement. Fetal bradycardia noted at that time and stat C/section called but then deferred after bradycardia resolved.  She is being treated with betamethasone (first dose 1515) and will be started on neuroprotective MgSO4 pending further evaluation and close observation of fetal status.  Dr. Despina HiddenEure reports US shows IUGR (24 wk size) and oligohydramnios along with decreased to absent end-diastolic flow.  Spoke with patient and FOB about usual expectations for preterm infant at  5327 - [redacted] weeks gestation, including uncertainty due to complication of IUGR and unknown exact EGA.  Discussed possible needs for DR resuscitation, respiratory support, IV access, and risks, including lung disease, brain injury, and death. Projected possible length of stay in NICU until due date.  Discussed advantages of feeding with mother's milk and family participation in care.  Both patient and FOB were attentive. They had no questions but expressed appreciation for my input.  Thank you for consulting Neonatology.  Total time 30 minutes  JWimmer, MD

## 2016-01-26 NOTE — H&P (Signed)
History     CSN: [id number]  Arrival date and time: 01/26/16 1412   None     Chief Complaint  Patient presents with  . Decreased Fetal Movement   HPI   [name] is a  21 y.o. female G1P0 @ 3623w4d with no prenatal care here in MAU with abdominal cramps and no fetal movement X 1 week. Patient unsure of her dating, LMP is uncertain and she has had very little prenatal care.   [place] and [place] some care there. She found out she was pregnant just prior to going to [place].  She [place] and was told that her cervix was "short".   Patient has had this off and on abdominal pain. The pain and the decreased fetal movement are both the reasons for her coming to the ER. The pain is sharp when it comes and is very painful. The pain has been present for 3 days. The pain is worse in the bottom of her stomach. She denies vaginal bleeding.   OB History    Gravida Para Term Preterm AB Living   1             SAB TAB Ectopic Multiple Live Births                  Past Medical History:  Diagnosis Date  . Dental crowns present    also dental caps  . Medial meniscus tear 11/2014   right knee  . Right ACL tear 11/2014    Past Surgical History:  Procedure Laterality Date  . KNEE ARTHROSCOPY WITH ANTERIOR CRUCIATE LIGAMENT (ACL) REPAIR Right 11/23/2014   Procedure: KNEE ARTHROSCOPY WITH ANTERIOR CRUCIATE LIGAMENT (ACL) REPAIR PARTIAL INTERIOR MEDIAL MENISCECTOMY;  Surgeon: Gean BirchwoodFrank Rowan, MD;  Location: [facility];  Service: Orthopedics;  Laterality: Right;  . KNEE ARTHROSCOPY WITH MEDIAL MENISECTOMY Left 11/23/2014   Procedure: KNEE ARTHROSCOPY WITH MEDIAL MENISECTOMY;  Surgeon: Gean BirchwoodFrank Rowan, MD;  Location:  SURGERY CENTER;  Service: Orthopedics;  Laterality: Left;  . WISDOM TOOTH EXTRACTION      History reviewed. No pertinent family history.  Social History  Substance Use Topics  . Smoking status: Never Smoker  .  Smokeless tobacco: Never Used  . Alcohol use No    Allergies:  Allergies  Allergen Reactions  . Shrimp [Shellfish Allergy] Hives    TONGUE SWELLING, THROAT ITCHING    Prescriptions Prior to Admission  Medication Sig Dispense Refill Last Dose  . Prenatal Vit-Fe Fumarate-FA (PRENATAL MULTIVITAMIN) TABS tablet Take 1 tablet by mouth daily at 12 noon.   01/25/2016 at Unknown time  . ciprofloxacin (CIPRO) 500 MG tablet Take 1 tablet (500 mg total) by mouth 2 (two) times daily. Take 1-2 days until diarrhea resolves (Patient not taking: Reported on 01/26/2016) 8 tablet 0 Completed Course at Unknown time  . mefloquine (LARIAM) 250 MG tablet Take 1 tablet (250 mg total) by mouth every 7 (seven) days. Start 2 weeks prior to travel and continue through 4 weeks after return from malarious area (Patient not taking: Reported on 01/26/2016) 3 tablet 0 Not Taking at Unknown time  . mefloquine (LARIAM) 250 MG tablet Take 1 tablet (250 mg total) by mouth every 7 (seven) days. Start 2 weeks prior to travel and continue through 4 weeks after return from malarious area (Patient not taking: Reported on 01/26/2016) 30 tablet 0 Not Taking at Unknown time  . oxyCODONE-acetaminophen (ROXICET) 5-325 MG tablet  Take 1 tablet by mouth every 4 (four) hours as needed. (Patient not taking: Reported on 02/25/2015) 60 tablet 0 Not Taking at Unknown time  . typhoid (VIVOTIF) DR capsule Take 1 capsule by mouth every other day. Keep refrigerated (Patient not taking: Reported on 01/26/2016) 4 capsule 0 Not Taking at Unknown time   Results for orders placed or performed during the hospital encounter of 01/26/16 (from the past 48 hour(s))  Urinalysis, Routine w reflex microscopic     Status: Abnormal   Collection Time: 01/26/16  2:25 PM  Result Value Ref Range   Color, Urine STRAW (A) YELLOW   APPearance CLEAR CLEAR   Specific Gravity, Urine 1.003 (L) 1.005 - 1.030   pH 7.0 5.0 - 8.0   Glucose, UA NEGATIVE NEGATIVE mg/dL   Hgb urine  dipstick NEGATIVE NEGATIVE   Bilirubin Urine NEGATIVE NEGATIVE   Ketones, ur NEGATIVE NEGATIVE mg/dL   Protein, ur NEGATIVE NEGATIVE mg/dL   Nitrite NEGATIVE NEGATIVE   Leukocytes, UA NEGATIVE NEGATIVE  CBC     Status: Abnormal   Collection Time: 01/26/16  3:20 PM  Result Value Ref Range   WBC 7.0 4.0 - 10.5 K/uL   RBC 3.97 3.87 - 5.11 MIL/uL   Hemoglobin 11.3 (L) 12.0 - 15.0 g/dL   HCT 16.132.8 (L) 09.636.0 - 04.546.0 %   MCV 82.6 78.0 - 100.0 fL   MCH 28.5 26.0 - 34.0 pg   MCHC 34.5 30.0 - 36.0 g/dL   RDW 40.913.1 81.111.5 - 91.415.5 %   Platelets 251 150 - 400 K/uL  Differential     Status: None   Collection Time: 01/26/16  3:20 PM  Result Value Ref Range   Neutrophils Relative % 63 %   Neutro Abs 4.4 1.7 - 7.7 K/uL   Lymphocytes Relative 31 %   Lymphs Abs 2.1 0.7 - 4.0 K/uL   Monocytes Relative 5 %   Monocytes Absolute 0.3 0.1 - 1.0 K/uL   Eosinophils Relative 1 %   Eosinophils Absolute 0.1 0.0 - 0.7 K/uL   Basophils Relative 0 %   Basophils Absolute 0.0 0.0 - 0.1 K/uL  Rapid HIV screen (HIV 1/2 Ab+Ag)     Status: None   Collection Time: 01/26/16  3:20 PM  Result Value Ref Range   HIV-1 P24 Antigen - HIV24 NON REACTIVE NON REACTIVE   HIV 1/2 Antibodies NON REACTIVE NON REACTIVE   Interpretation (HIV Ag Ab)      A non reactive test result means that HIV 1 or HIV 2 antibodies and HIV 1 p24 antigen were not detected in the specimen.  Fibrinogen     Status: None   Collection Time: 01/26/16  3:20 PM  Result Value Ref Range   Fibrinogen 426 210 - 475 mg/dL  APTT     Status: None   Collection Time: 01/26/16  3:20 PM  Result Value Ref Range   aPTT 28 24 - 36 seconds  Protime-INR     Status: None   Collection Time: 01/26/16  3:20 PM  Result Value Ref Range   Prothrombin Time 13.6 11.4 - 15.2 seconds   INR 1.04   Type and screen [facility]     Status: None (Preliminary result)   Collection Time: 01/26/16  3:20 PM  Result Value Ref Range   ABO/RH(D) AB POS    Antibody  Screen PENDING    Sample Expiration 01/29/2016   Amnisure rupture of membrane (rom)not at [facility]  Status: None   Collection Time: 01/26/16  3:35 PM  Result Value Ref Range   Amnisure ROM NEGATIVE     Review of Systems  Constitutional: Negative for fever.  Gastrointestinal: Positive for abdominal pain.  Genitourinary: Negative for vaginal bleeding.  Neurological: Negative for dizziness and headaches.   Physical Exam   Blood pressure 112/67, pulse 94, temperature 99 F (37.2 C), temperature source Oral, resp. rate 16, weight 149 lb 9.6 oz (67.9 kg), last menstrual period 02/22/2015.  Physical Exam  Constitutional: She is oriented to person, place, and time. She appears well-developed and well-nourished. No distress.  GI: Soft. She exhibits no distension. There is no tenderness. There is no rebound.  Musculoskeletal: Normal range of motion.  Neurological: She is alert and oriented to person, place, and time.  Skin: Skin is warm. She is not diaphoretic.  Psychiatric: Her behavior is normal.   Fetal Tracing: Baseline: indeterminate  Variability: minimal  Accelerations: none Decelerations: prolonged deceleration at 1456 down to 110. Fetal heart rate back to 120's at 1500. Fetal monitor off from 1456-1500 due to bedside US.  Toco: none  Assessment and Plan   A:  1. Bradycardia found on measurement of baseline fetal heart rate   2. No prenatal care in current pregnancy   3. Oligohydramnios antepartum, second trimester, fetus 1     P:  Admit to Labor and Delivery  Guarded condition NICU consult Betamethasone, repeat in 24 hours.   Duane Lope, NP 01/26/2016  5:24 PM

## 2016-01-26 NOTE — Progress Notes (Signed)
I offered initial support to pt and her boyfriend who were still somewhat in shock.  Pt was trying very hard to stay calm and her boyfriend, Lorriane ShireBrendan, was trying to help keep her calm.  They are aware of our ongoing availability for spiritual and emotional support, but please also page as needs arise.  Chaplain Katy Kaleiah Kutzer, Bcc Pager, [phone] 5:01 PM    01/26/16 1600  Clinical Encounter Type  Visited With Patient and family together  Visit Type Initial

## 2016-01-26 NOTE — MAU Provider Note (Signed)
History     CSN: [id number]  Arrival date and time: 01/26/16 1412   None     Chief Complaint  Patient presents with  . Decreased Fetal Movement   HPI   Amy Neal is a  21 y.o. female G1P0 @ 3623w4d with no prenatal care here in MAU with abdominal cramps and no fetal movement X 1 week. Patient unsure of her dating, LMP is uncertain and she has had very little prenatal care.   [place] was [place] in [place] and received some care there. She found out she was pregnant just prior to going to [place].  She had [place] in [place] and was told that her cervix was "short".   Patient has had this off and on abdominal pain. The pain and the decreased fetal movement are both the reasons for her coming to the ER. The pain is sharp when it comes and is very painful. The pain has been present for 3 days. The pain is worse in the bottom of her stomach. She denies vaginal bleeding.   OB History    Gravida Para Term Preterm AB Living   1             SAB TAB Ectopic Multiple Live Births                  Past Medical History:  Diagnosis Date  . Dental crowns present    also dental caps  . Medial meniscus tear 11/2014   right knee  . Right ACL tear 11/2014    Past Surgical History:  Procedure Laterality Date  . KNEE ARTHROSCOPY WITH ANTERIOR CRUCIATE LIGAMENT (ACL) REPAIR Right 11/23/2014   Procedure: KNEE ARTHROSCOPY WITH ANTERIOR CRUCIATE LIGAMENT (ACL) REPAIR PARTIAL INTERIOR MEDIAL MENISCECTOMY;  Surgeon: Gean BirchwoodFrank Rowan, MD;  Location: [facility];  Service: Orthopedics;  Laterality: Right;  . KNEE ARTHROSCOPY WITH MEDIAL MENISECTOMY Left 11/23/2014   Procedure: KNEE ARTHROSCOPY WITH MEDIAL MENISECTOMY;  Surgeon: Gean BirchwoodFrank Rowan, MD;  Location:  SURGERY CENTER;  Service: Orthopedics;  Laterality: Left;  . WISDOM TOOTH EXTRACTION      History reviewed. No pertinent family history.  Social History  Substance Use Topics  . Smoking status: Never Smoker  .  Smokeless tobacco: Never Used  . Alcohol use No    Allergies:  Allergies  Allergen Reactions  . Shrimp [Shellfish Allergy] Hives    TONGUE SWELLING, THROAT ITCHING    Prescriptions Prior to Admission  Medication Sig Dispense Refill Last Dose  . Prenatal Vit-Fe Fumarate-FA (PRENATAL MULTIVITAMIN) TABS tablet Take 1 tablet by mouth daily at 12 noon.   01/25/2016 at Unknown time  . ciprofloxacin (CIPRO) 500 MG tablet Take 1 tablet (500 mg total) by mouth 2 (two) times daily. Take 1-2 days until diarrhea resolves (Patient not taking: Reported on 01/26/2016) 8 tablet 0 Completed Course at Unknown time  . mefloquine (LARIAM) 250 MG tablet Take 1 tablet (250 mg total) by mouth every 7 (seven) days. Start 2 weeks prior to travel and continue through 4 weeks after return from malarious area (Patient not taking: Reported on 01/26/2016) 3 tablet 0 Not Taking at Unknown time  . mefloquine (LARIAM) 250 MG tablet Take 1 tablet (250 mg total) by mouth every 7 (seven) days. Start 2 weeks prior to travel and continue through 4 weeks after return from malarious area (Patient not taking: Reported on 01/26/2016) 30 tablet 0 Not Taking at Unknown time  . oxyCODONE-acetaminophen (ROXICET) 5-325 MG tablet  Take 1 tablet by mouth every 4 (four) hours as needed. (Patient not taking: Reported on 02/25/2015) 60 tablet 0 Not Taking at Unknown time  . typhoid (VIVOTIF) DR capsule Take 1 capsule by mouth every other day. Keep refrigerated (Patient not taking: Reported on 01/26/2016) 4 capsule 0 Not Taking at Unknown time   Results for orders placed or performed during the hospital encounter of 01/26/16 (from the past 48 hour(s))  Urinalysis, Routine w reflex microscopic     Status: Abnormal   Collection Time: 01/26/16  2:25 PM  Result Value Ref Range   Color, Urine STRAW (A) YELLOW   APPearance CLEAR CLEAR   Specific Gravity, Urine 1.003 (L) 1.005 - 1.030   pH 7.0 5.0 - 8.0   Glucose, UA NEGATIVE NEGATIVE mg/dL   Hgb urine  dipstick NEGATIVE NEGATIVE   Bilirubin Urine NEGATIVE NEGATIVE   Ketones, ur NEGATIVE NEGATIVE mg/dL   Protein, ur NEGATIVE NEGATIVE mg/dL   Nitrite NEGATIVE NEGATIVE   Leukocytes, UA NEGATIVE NEGATIVE  CBC     Status: Abnormal   Collection Time: 01/26/16  3:20 PM  Result Value Ref Range   WBC 7.0 4.0 - 10.5 K/uL   RBC 3.97 3.87 - 5.11 MIL/uL   Hemoglobin 11.3 (L) 12.0 - 15.0 g/dL   HCT 16.132.8 (L) 09.636.0 - 04.546.0 %   MCV 82.6 78.0 - 100.0 fL   MCH 28.5 26.0 - 34.0 pg   MCHC 34.5 30.0 - 36.0 g/dL   RDW 40.913.1 81.111.5 - 91.415.5 %   Platelets 251 150 - 400 K/uL  Differential     Status: None   Collection Time: 01/26/16  3:20 PM  Result Value Ref Range   Neutrophils Relative % 63 %   Neutro Abs 4.4 1.7 - 7.7 K/uL   Lymphocytes Relative 31 %   Lymphs Abs 2.1 0.7 - 4.0 K/uL   Monocytes Relative 5 %   Monocytes Absolute 0.3 0.1 - 1.0 K/uL   Eosinophils Relative 1 %   Eosinophils Absolute 0.1 0.0 - 0.7 K/uL   Basophils Relative 0 %   Basophils Absolute 0.0 0.0 - 0.1 K/uL  Rapid HIV screen (HIV 1/2 Ab+Ag)     Status: None   Collection Time: 01/26/16  3:20 PM  Result Value Ref Range   HIV-1 P24 Antigen - HIV24 NON REACTIVE NON REACTIVE   HIV 1/2 Antibodies NON REACTIVE NON REACTIVE   Interpretation (HIV Ag Ab)      A non reactive test result means that HIV 1 or HIV 2 antibodies and HIV 1 p24 antigen were not detected in the specimen.  Fibrinogen     Status: None   Collection Time: 01/26/16  3:20 PM  Result Value Ref Range   Fibrinogen 426 210 - 475 mg/dL  APTT     Status: None   Collection Time: 01/26/16  3:20 PM  Result Value Ref Range   aPTT 28 24 - 36 seconds  Protime-INR     Status: None   Collection Time: 01/26/16  3:20 PM  Result Value Ref Range   Prothrombin Time 13.6 11.4 - 15.2 seconds   INR 1.04   Type and screen [facility]     Status: None (Preliminary result)   Collection Time: 01/26/16  3:20 PM  Result Value Ref Range   ABO/RH(D) AB POS    Antibody  Screen PENDING    Sample Expiration 01/29/2016   Amnisure rupture of membrane (rom)not at [facility]  Status: None   Collection Time: 01/26/16  3:35 PM  Result Value Ref Range   Amnisure ROM NEGATIVE     Review of Systems  Constitutional: Negative for fever.  Gastrointestinal: Positive for abdominal pain.  Genitourinary: Negative for vaginal bleeding.  Neurological: Negative for dizziness and headaches.   Physical Exam   Blood pressure 112/67, pulse 94, temperature 99 F (37.2 C), temperature source Oral, resp. rate 16, weight 149 lb 9.6 oz (67.9 kg), last menstrual period 02/22/2015.  Physical Exam  Constitutional: She is oriented to person, place, and time. She appears well-developed and well-nourished. No distress.  GI: Soft. She exhibits no distension. There is no tenderness. There is no rebound.  Musculoskeletal: Normal range of motion.  Neurological: She is alert and oriented to person, place, and time.  Skin: Skin is warm. She is not diaphoretic.  Psychiatric: Her behavior is normal.   Fetal Tracing: Baseline: indeterminate  Variability: minimal  Accelerations: none Decelerations: prolonged deceleration at 1456 down to 110. Fetal heart rate back to 120's at 1500. Fetal monitor off from 1456-1500 due to bedside US.  Toco: none  MAU Course  Procedures  None  MDM  NST   1456: NP in the room with patient, Fetal bradycardia noted on NST, patient repositioned to her left side; attempted fetal heart tones; unable to obtained. I called out to RN desk for IV establishment and stat bedside BPP. Dr. Jearld Lesch in MAU and called to the room with portable bedside US. Portable US confirmed fetal bradycardia with fetal heart rate in the 70's. Stat cesarean section called. Fetal HR improved after 4 minutes, back to the 120's, Stat Cesarean section held at this time. Dr. Penne Lash at bedside. Fetal heart rate stable at this time. IV established, NICU at bedside, Ultrasound at bedside.   -Betamethasone given.  -Amnisure collected and negative  -AB positive blood type   Assessment and Plan   A:  1. Bradycardia found on measurement of baseline fetal heart rate   2. No prenatal care in current pregnancy   3. Oligohydramnios antepartum, second trimester, fetus 1     P:  Admit to labor and delivery Guarded condition NICU consult Betamethasone, repeat in 24 hours     Duane Lope, NP 01/26/2016 4:46 PM

## 2016-01-27 ENCOUNTER — Encounter (HOSPITAL_COMMUNITY): Payer: Self-pay | Admitting: Obstetrics & Gynecology

## 2016-01-27 ENCOUNTER — Encounter (HOSPITAL_COMMUNITY)
Admission: AD | Disposition: A | Discharge status: Home / Self Care | Payer: Self-pay | Source: Ambulatory Visit | Attending: Obstetrics & Gynecology

## 2016-01-27 ENCOUNTER — Inpatient Hospital Stay (HOSPITAL_COMMUNITY): Payer: Medicaid Other | Admitting: Anesthesiology

## 2016-01-27 DIAGNOSIS — Z3A24 24 weeks gestation of pregnancy: Secondary | ICD-10-CM

## 2016-01-27 DIAGNOSIS — O4102X0 Oligohydramnios, second trimester, not applicable or unspecified: Secondary | ICD-10-CM

## 2016-01-27 DIAGNOSIS — O368130 Decreased fetal movements, third trimester, not applicable or unspecified: Secondary | ICD-10-CM

## 2016-01-27 LAB — RPR: RPR Ser Ql: NONREACTIVE

## 2016-01-27 SURGERY — Surgical Case
Anesthesia: Monitor Anesthesia Care

## 2016-01-27 MED ORDER — SIMETHICONE 80 MG PO CHEW
80.0000 mg | CHEWABLE_TABLET | ORAL | Status: DC
  Administered 2016-01-29: 80 mg via ORAL
  Filled 2016-01-27 (×2): qty 1

## 2016-01-27 MED ORDER — FENTANYL CITRATE (PF) 100 MCG/2ML IJ SOLN
INTRAMUSCULAR | Status: DC | PRN
  Administered 2016-01-27: 20 ug via INTRATHECAL

## 2016-01-27 MED ORDER — MORPHINE SULFATE (PF) 0.5 MG/ML IJ SOLN
INTRAMUSCULAR | Status: DC | PRN
  Administered 2016-01-27: .2 mg via INTRATHECAL

## 2016-01-27 MED ORDER — KETOROLAC TROMETHAMINE 30 MG/ML IJ SOLN: 30.0000 mg | Freq: Four times a day (QID) | INTRAMUSCULAR | Status: DC | PRN

## 2016-01-27 MED ORDER — DEXAMETHASONE SODIUM PHOSPHATE 4 MG/ML IJ SOLN
INTRAMUSCULAR | Status: DC | PRN
  Administered 2016-01-27: 4 mg via INTRAVENOUS

## 2016-01-27 MED ORDER — SENNOSIDES-DOCUSATE SODIUM 8.6-50 MG PO TABS
2.0000 | ORAL_TABLET | ORAL | Status: DC
  Administered 2016-01-27 – 2016-01-29 (×3): 2 via ORAL
  Filled 2016-01-27 (×3): qty 2

## 2016-01-27 MED ORDER — MORPHINE SULFATE (PF) 0.5 MG/ML IJ SOLN
INTRAMUSCULAR | Status: AC
  Filled 2016-01-27: qty 10

## 2016-01-27 MED ORDER — MEPERIDINE HCL 25 MG/ML IJ SOLN: 6.2500 mg | INTRAMUSCULAR | Status: DC | PRN

## 2016-01-27 MED ORDER — LACTATED RINGERS IV SOLN: INTRAVENOUS | Status: DC

## 2016-01-27 MED ORDER — DEXAMETHASONE SODIUM PHOSPHATE 4 MG/ML IJ SOLN
INTRAMUSCULAR | Status: AC
  Filled 2016-01-27: qty 1

## 2016-01-27 MED ORDER — IBUPROFEN 600 MG PO TABS: 600.0000 mg | ORAL_TABLET | Freq: Four times a day (QID) | ORAL | Status: DC | PRN

## 2016-01-27 MED ORDER — CEFAZOLIN SODIUM-DEXTROSE 2-3 GM-% IV SOLR
INTRAVENOUS | Status: DC | PRN
  Administered 2016-01-27: 2 g via INTRAVENOUS

## 2016-01-27 MED ORDER — NALBUPHINE HCL 10 MG/ML IJ SOLN: 5.0000 mg | Freq: Once | INTRAMUSCULAR | Status: DC | PRN

## 2016-01-27 MED ORDER — NALOXONE HCL 2 MG/2ML IJ SOSY
1.0000 ug/kg/h | PREFILLED_SYRINGE | INTRAVENOUS | Status: DC | PRN
  Filled 2016-01-27: qty 2

## 2016-01-27 MED ORDER — PHENYLEPHRINE 8 MG IN D5W 100 ML (0.08MG/ML) PREMIX OPTIME
INJECTION | INTRAVENOUS | Status: DC | PRN
  Administered 2016-01-27: 60 ug/min via INTRAVENOUS

## 2016-01-27 MED ORDER — DIPHENHYDRAMINE HCL 25 MG PO CAPS: 25.0000 mg | ORAL_CAPSULE | Freq: Four times a day (QID) | ORAL | Status: DC | PRN

## 2016-01-27 MED ORDER — OXYCODONE HCL 5 MG PO TABS
5.0000 mg | ORAL_TABLET | ORAL | Status: DC | PRN
  Administered 2016-01-28 – 2016-01-30 (×2): 5 mg via ORAL
  Filled 2016-01-27 (×2): qty 1

## 2016-01-27 MED ORDER — KETOROLAC TROMETHAMINE 30 MG/ML IJ SOLN
INTRAMUSCULAR | Status: AC
  Filled 2016-01-27: qty 1

## 2016-01-27 MED ORDER — WITCH HAZEL-GLYCERIN EX PADS: 1.0000 "application " | MEDICATED_PAD | CUTANEOUS | Status: DC | PRN

## 2016-01-27 MED ORDER — OXYTOCIN 40 UNITS IN LACTATED RINGERS INFUSION - SIMPLE MED: 2.5000 [IU]/h | INTRAVENOUS | Status: DC

## 2016-01-27 MED ORDER — LIDOCAINE HCL (CARDIAC) 20 MG/ML IV SOLN
INTRAVENOUS | Status: AC
  Filled 2016-01-27: qty 5

## 2016-01-27 MED ORDER — NALBUPHINE HCL 10 MG/ML IJ SOLN: 5.0000 mg | INTRAMUSCULAR | Status: DC | PRN

## 2016-01-27 MED ORDER — ONDANSETRON HCL 4 MG/2ML IJ SOLN: 4.0000 mg | Freq: Three times a day (TID) | INTRAMUSCULAR | Status: DC | PRN

## 2016-01-27 MED ORDER — PROMETHAZINE HCL 25 MG/ML IJ SOLN: 6.2500 mg | INTRAMUSCULAR | Status: DC | PRN

## 2016-01-27 MED ORDER — ZOLPIDEM TARTRATE 5 MG PO TABS: 5.0000 mg | ORAL_TABLET | Freq: Every evening | ORAL | Status: DC | PRN

## 2016-01-27 MED ORDER — KETOROLAC TROMETHAMINE 30 MG/ML IJ SOLN
30.0000 mg | Freq: Four times a day (QID) | INTRAMUSCULAR | Status: DC | PRN
  Administered 2016-01-27: 30 mg via INTRAMUSCULAR

## 2016-01-27 MED ORDER — METHYLERGONOVINE MALEATE 0.2 MG/ML IJ SOLN: 0.2000 mg | INTRAMUSCULAR | Status: DC | PRN

## 2016-01-27 MED ORDER — NALOXONE HCL 0.4 MG/ML IJ SOLN: 0.4000 mg | INTRAMUSCULAR | Status: DC | PRN

## 2016-01-27 MED ORDER — ROCURONIUM BROMIDE 100 MG/10ML IV SOLN
INTRAVENOUS | Status: AC
  Filled 2016-01-27: qty 1

## 2016-01-27 MED ORDER — MIDAZOLAM HCL 2 MG/2ML IJ SOLN
INTRAMUSCULAR | Status: AC
  Filled 2016-01-27: qty 2

## 2016-01-27 MED ORDER — DIBUCAINE 1 % RE OINT: 1.0000 "application " | TOPICAL_OINTMENT | RECTAL | Status: DC | PRN

## 2016-01-27 MED ORDER — METHYLERGONOVINE MALEATE 0.2 MG PO TABS: 0.2000 mg | ORAL_TABLET | ORAL | Status: DC | PRN

## 2016-01-27 MED ORDER — SIMETHICONE 80 MG PO CHEW
80.0000 mg | CHEWABLE_TABLET | ORAL | Status: DC | PRN
  Administered 2016-01-27: 80 mg via ORAL

## 2016-01-27 MED ORDER — OXYTOCIN 10 UNIT/ML IJ SOLN
INTRAVENOUS | Status: DC | PRN
  Administered 2016-01-27: 40 [IU] via INTRAVENOUS

## 2016-01-27 MED ORDER — ONDANSETRON HCL 4 MG/2ML IJ SOLN
INTRAMUSCULAR | Status: AC
  Filled 2016-01-27: qty 2

## 2016-01-27 MED ORDER — OXYTOCIN 10 UNIT/ML IJ SOLN: INTRAVENOUS | Status: DC | PRN

## 2016-01-27 MED ORDER — HYDROMORPHONE HCL 1 MG/ML IJ SOLN: 0.2500 mg | INTRAMUSCULAR | Status: DC | PRN

## 2016-01-27 MED ORDER — SCOPOLAMINE 1 MG/3DAYS TD PT72SCOPOLAMINE 1 MG/3DAYS
MEDICATED_PATCH | TRANSDERMAL | Status: DC | PRN
Start: 2016-01-27 — End: 2016-01-27
  Administered 2016-01-27: 1 via TRANSDERMAL

## 2016-01-27 MED ORDER — MENTHOL 3 MG MT LOZG: 1.0000 | LOZENGE | OROMUCOSAL | Status: DC | PRN

## 2016-01-27 MED ORDER — COCONUT OIL OIL: 1.0000 "application " | TOPICAL_OIL | Status: DC | PRN

## 2016-01-27 MED ORDER — SCOPOLAMINE 1 MG/3DAYS TD PT72
MEDICATED_PATCH | TRANSDERMAL | Status: AC
  Filled 2016-01-27: qty 1

## 2016-01-27 MED ORDER — SCOPOLAMINE 1 MG/3DAYS TD PT72: 1.0000 | MEDICATED_PATCH | Freq: Once | TRANSDERMAL | Status: DC

## 2016-01-27 MED ORDER — PHENYLEPHRINE HCL 10 MG/ML IJ SOLN
INTRAMUSCULAR | Status: DC | PRN
  Administered 2016-01-27 (×2): 80 ug via INTRAVENOUS

## 2016-01-27 MED ORDER — PROPOFOL 10 MG/ML IV BOLUS
INTRAVENOUS | Status: AC
  Filled 2016-01-27: qty 20

## 2016-01-27 MED ORDER — LACTATED RINGERS IV SOLN
INTRAVENOUS | Status: DC | PRN
  Administered 2016-01-27 (×2): via INTRAVENOUS

## 2016-01-27 MED ORDER — SIMETHICONE 80 MG PO CHEW
80.0000 mg | CHEWABLE_TABLET | Freq: Three times a day (TID) | ORAL | Status: DC
  Administered 2016-01-27 – 2016-01-29 (×3): 80 mg via ORAL
  Filled 2016-01-27 (×6): qty 1

## 2016-01-27 MED ORDER — PRENATAL MULTIVITAMIN CH
1.0000 | ORAL_TABLET | Freq: Every day | ORAL | Status: DC
  Filled 2016-01-27 (×2): qty 1

## 2016-01-27 MED ORDER — TETANUS-DIPHTH-ACELL PERTUSSIS 5-2.5-18.5 LF-MCG/0.5 IM SUSP
0.5000 mL | Freq: Once | INTRAMUSCULAR | Status: AC
  Administered 2016-01-28: 0.5 mL via INTRAMUSCULAR

## 2016-01-27 MED ORDER — KETOROLAC TROMETHAMINE 30 MG/ML IJ SOLN: 30.0000 mg | Freq: Once | INTRAMUSCULAR | Status: DC

## 2016-01-27 MED ORDER — ACETAMINOPHEN 325 MG PO TABS: 650.0000 mg | ORAL_TABLET | ORAL | Status: DC | PRN

## 2016-01-27 MED ORDER — ONDANSETRON HCL 4 MG/2ML IJ SOLN
INTRAMUSCULAR | Status: DC | PRN
  Administered 2016-01-27: 4 mg via INTRAVENOUS

## 2016-01-27 MED ORDER — BUPIVACAINE IN DEXTROSE 0.75-8.25 % IT SOLN
INTRATHECAL | Status: DC | PRN
  Administered 2016-01-27: 1.4 mL via INTRATHECAL

## 2016-01-27 MED ORDER — DIPHENHYDRAMINE HCL 50 MG/ML IJ SOLN: 12.5000 mg | INTRAMUSCULAR | Status: DC | PRN

## 2016-01-27 MED ORDER — SODIUM CHLORIDE 0.9% FLUSH: 3.0000 mL | INTRAVENOUS | Status: DC | PRN

## 2016-01-27 MED ORDER — DIPHENHYDRAMINE HCL 25 MG PO CAPS
25.0000 mg | ORAL_CAPSULE | ORAL | Status: DC | PRN
  Filled 2016-01-27: qty 1

## 2016-01-27 MED ORDER — ACETAMINOPHEN 500 MG PO TABS
1000.0000 mg | ORAL_TABLET | Freq: Four times a day (QID) | ORAL | Status: AC
  Administered 2016-01-27 – 2016-01-28 (×4): 1000 mg via ORAL
  Filled 2016-01-27 (×4): qty 2

## 2016-01-27 MED ORDER — IBUPROFEN 600 MG PO TABS
600.0000 mg | ORAL_TABLET | Freq: Four times a day (QID) | ORAL | Status: DC
  Administered 2016-01-27 – 2016-01-30 (×12): 600 mg via ORAL
  Filled 2016-01-27 (×12): qty 1

## 2016-01-27 MED ORDER — SODIUM CHLORIDE 0.9 % IR SOLN
Status: DC | PRN
  Administered 2016-01-27: 1000 mL

## 2016-01-27 MED ORDER — PRENATAL MULTIVITAMIN CH
1.0000 | ORAL_TABLET | Freq: Every day | ORAL | Status: DC
  Administered 2016-01-27 – 2016-01-28 (×2): 1 via ORAL

## 2016-01-27 MED ORDER — FENTANYL CITRATE (PF) 250 MCG/5ML IJ SOLN
INTRAMUSCULAR | Status: AC
  Filled 2016-01-27: qty 5

## 2016-01-27 SURGICAL SUPPLY — 31 items
CHLORAPREP W/TINT 26ML (MISCELLANEOUS) ×6 IMPLANT
CLAMP CORD UMBIL (MISCELLANEOUS) ×3 IMPLANT
CLOTH BEACON ORANGE TIMEOUT ST (SAFETY) ×3 IMPLANT
DERMABOND ADVANCED (GAUZE/BANDAGES/DRESSINGS) ×2
DERMABOND ADVANCED .7 DNX12 (GAUZE/BANDAGES/DRESSINGS) ×1 IMPLANT
DRSG OPSITE POSTOP 4X10 (GAUZE/BANDAGES/DRESSINGS) ×3 IMPLANT
ELECT REM PT RETURN 9FT ADLT (ELECTROSURGICAL) ×3
ELECTRODE REM PT RTRN 9FT ADLT (ELECTROSURGICAL) ×1 IMPLANT
GLOVE BIOGEL PI IND STRL 7.0 (GLOVE) ×1 IMPLANT
GLOVE BIOGEL PI IND STRL 8 (GLOVE) ×1 IMPLANT
GLOVE BIOGEL PI INDICATOR 7.0 (GLOVE) ×2
GLOVE BIOGEL PI INDICATOR 8 (GLOVE) ×2
GLOVE ECLIPSE 8.0 STRL XLNG CF (GLOVE) ×3 IMPLANT
GOWN STRL REUS W/TWL LRG LVL3 (GOWN DISPOSABLE) ×6 IMPLANT
KIT ABG SYR 3ML LUER SLIP (SYRINGE) ×3 IMPLANT
NEEDLE HYPO 18GX1.5 BLUNT FILL (NEEDLE) ×3 IMPLANT
NEEDLE HYPO 22GX1.5 SAFETY (NEEDLE) ×3 IMPLANT
NEEDLE HYPO 25X5/8 SAFETYGLIDE (NEEDLE) ×3 IMPLANT
NS IRRIG 1000ML POUR BTL (IV SOLUTION) ×3 IMPLANT
PACK C SECTION WH (CUSTOM PROCEDURE TRAY) ×3 IMPLANT
PAD OB MATERNITY 4.3X12.25 (PERSONAL CARE ITEMS) ×3 IMPLANT
PENCIL SMOKE EVAC W/HOLSTER (ELECTROSURGICAL) ×3 IMPLANT
SUT CHROMIC 0 CT 1 (SUTURE) ×3 IMPLANT
SUT MNCRL 0 VIOLET CTX 36 (SUTURE) ×2 IMPLANT
SUT MONOCRYL 0 CTX 36 (SUTURE) ×4
SUT VIC AB 0 CTX 36 (SUTURE) ×2
SUT VIC AB 0 CTX36XBRD ANBCTRL (SUTURE) ×1 IMPLANT
SUT VIC AB 4-0 KS 27 (SUTURE) ×3 IMPLANT
SYR 20CC LL (SYRINGE) ×6 IMPLANT
TOWEL OR 17X24 6PK STRL BLUE (TOWEL DISPOSABLE) ×3 IMPLANT
TRAY FOLEY CATH SILVER 14FR (SET/KITS/TRAYS/PACK) ×3 IMPLANT

## 2016-01-27 NOTE — Anesthesia Preprocedure Evaluation (Signed)
Anesthesia Evaluation  Patient identified by MRN, date of birth, ID band Patient awake    Reviewed: Allergy & Precautions, NPO status , Patient's Chart, lab work & pertinent test results  Airway Mallampati: II  TM Distance: >3 FB Neck ROM: Full    Dental no notable dental hx. (+) Dental Advisory Given   Pulmonary neg pulmonary ROS,    Pulmonary exam normal        Cardiovascular negative cardio ROS Normal cardiovascular exam     Neuro/Psych negative neurological ROS  negative psych ROS   GI/Hepatic negative GI ROS, Neg liver ROS,   Endo/Other  negative endocrine ROS  Renal/GU negative Renal ROS  negative genitourinary   Musculoskeletal negative musculoskeletal ROS (+)   Abdominal   Peds negative pediatric ROS (+)  Hematology negative hematology ROS (+)   Anesthesia Other Findings   Reproductive/Obstetrics negative OB ROS                             Anesthesia Physical Anesthesia Plan  ASA: II and emergent  Anesthesia Plan: MAC and Spinal   Post-op Pain Management:    Induction:   Airway Management Planned:   Additional Equipment:   Intra-op Plan:   Post-operative Plan:   Informed Consent: I have reviewed the patients History and Physical, chart, labs and discussed the procedure including the risks, benefits and alternatives for the proposed anesthesia with the patient or authorized representative who has indicated his/her understanding and acceptance.   Dental advisory given  Plan Discussed with: CRNA and Anesthesiologist  Anesthesia Plan Comments:         Anesthesia Quick Evaluation

## 2016-01-27 NOTE — Anesthesia Procedure Notes (Signed)
Spinal  Patient location during procedure: OR Start time: 01/27/2016 7:07 AM End time: 01/27/2016 7:09 AM Staffing Anesthesiologist: Leilani AbleHATCHETT, Heman Que Performed: anesthesiologist  Preanesthetic Checklist Completed: patient identified, surgical consent, pre-op evaluation, timeout performed, IV checked, risks and benefits discussed and monitors and equipment checked Spinal Block Patient position: sitting Prep: site prepped and draped and DuraPrep Patient monitoring: heart rate, cardiac monitor, continuous pulse ox and blood pressure Approach: midline Location: L3-4 Injection technique: single-shot Needle Needle type: Sprotte  Needle gauge: 24 G Needle length: 9 cm Needle insertion depth: 5 cm Assessment Sensory level: T4

## 2016-01-27 NOTE — Progress Notes (Signed)
Attempted to follow up with patient after the delivery of her baby.  Patient was not available at the time.  Left note of introduction and support.  Will continue to follow.  Please page as further needs arise.  Maryanna ShapeAmanda M. Carley Hammedavee Lomax, M.Div. [facility] Chaplain Pager [phone] Office [phone]

## 2016-01-27 NOTE — Anesthesia Postprocedure Evaluation (Addendum)
Anesthesia Post Note  Patient: Amy Neal  Procedure(s) Performed: Procedure(s) (LRB): CESAREAN SECTION (N/A)  Patient location during evaluation: [facility] Anesthesia Type: MAC Level of consciousness: awake and alert Pain management: pain level controlled Vital Signs Assessment: post-procedure vital signs reviewed and stable Respiratory status: spontaneous breathing Cardiovascular status: blood pressure returned to baseline Postop Assessment: no headache, patient able to bend at knees, no backache, no signs of nausea or vomiting, adequate PO intake and spinal receding Anesthetic complications: no        Last Vitals:  Vitals:   01/27/16 0955 01/27/16 1100  BP: 104/63 (!) 94/52  Pulse: (!) 56 (!) 58  Resp: 16 16  Temp: 36.3 C     Last Pain:  Vitals:   01/27/16 1100  TempSrc:   PainSc: 3    Pain Goal:                 [name]

## 2016-01-27 NOTE — Anesthesia Postprocedure Evaluation (Signed)
Anesthesia Post Note  Patient: Amy Neal  Procedure(s) Performed: Procedure(s) (LRB): CESAREAN SECTION (N/A)  Patient location during evaluation: PACU Anesthesia Type: MAC Level of consciousness: awake and alert Pain management: pain level controlled Vital Signs Assessment: post-procedure vital signs reviewed and stable Respiratory status: spontaneous breathing and respiratory function stable Cardiovascular status: blood pressure returned to baseline and stable Postop Assessment: spinal receding Anesthetic complications: no        Last Vitals:  Vitals:   01/27/16 0945 01/27/16 0955  BP: (!) 106/92 104/63  Pulse: 61 (!) 56  Resp: 11 16  Temp:  36.3 C    Last Pain:  Vitals:   01/27/16 0955  TempSrc: Oral  PainSc: 0-No pain   Pain Goal:                 Rasool Rommel DANIEL

## 2016-01-27 NOTE — Addendum Note (Signed)
Addendum  created 01/27/16 1333 by Armanda Heritageharlesetta M Yvan Dority, CRNA   Sign clinical note

## 2016-01-27 NOTE — Transfer of Care (Signed)
Immediate Anesthesia Transfer of Care Note  Patient: Amy Neal  Procedure(s) Performed: Procedure(s): CESAREAN SECTION (N/A)  Patient Location: PACU  Anesthesia Type:Spinal  Level of Consciousness: awake, alert  and oriented  Airway & Oxygen Therapy: Patient Spontanous Breathing  Post-op Assessment: Report given to RN and Post -op Vital signs reviewed and stable  Post vital signs: Reviewed and stable  Last Vitals:  Vitals:   01/27/16 0500 01/27/16 0602  BP:  (!) 103/55  Pulse: 86 90  Resp:  18  Temp:      Last Pain:  Vitals:   01/27/16 0602  TempSrc:   PainSc: Asleep         Complications: No apparent anesthesia complications

## 2016-01-27 NOTE — Lactation Note (Signed)
This note was copied from a baby'Neal chart. Lactation Consultation Note  Patient Name: Amy Neal ZOXWR'UToday'Neal Date: 01/27/2016 Reason for consult: Initial assessment;NICU baby Breastfeeding consultation services and support information given to patient.  Providing Breastmilk For Your Baby in NICU booklet also given. This is mom'Neal first baby and she states she desires to provide breastmilk for her baby.  [facility] DEBP set up and initiated.  Mom taught hand expression.  Instructed to pump both breasts every 3 hours x 15 minutes followed by hand expression.  Mom does not have WIC but plans on calling for appointment.  [facility]  Maternal Data Has patient been taught Hand Expression?: Yes Does the patient have breastfeeding experience prior to this delivery?: No  Feeding    LATCH Score/Interventions                      Lactation Tools Discussed/Used WIC Program: No Pump Review: Setup, frequency, and cleaning;Milk Storage Initiated by:: LC Date initiated:: 01/27/16   Consult Status Consult Status: Follow-up Date: 01/28/16 Follow-up type: In-patient    [name], Amy Neal 01/27/2016, 1:27 PM

## 2016-01-27 NOTE — Op Note (Signed)
Preoperative diagnosis:  1.  Intrauterine pregnancy at 27-4941w5d  weeks gestation                                         2.  Fetal bradycardic episode, persistent                                         3.  IUGR with oligohydramnios, absent end diastolic flow                                         4.  Breech   Postoperative diagnosis:  Same as above plus absolutely no amniotic fluid  Procedure:  Stat Primary cesarean section:  Low vertical with significant extension into the myometrium:  Will require repeat Caesarean section at 36-37 weeks in the future  Surgeon:  Lazaro ArmsLuther H Eure MD  Assistant:    Anesthesia: Spinal  Findings:  The FHR pattern had experienced a drop in baseline around 0200 or so which persisted for the next few hours with no other worrisome decelerations.  I was concerned about this but there was variability at this lower baseline of 110s.  However I noticed a FHR of 80-90s at approximately 0650 or so.  I performed a sonogram which revealed a FHR in 70-80 persistent.  Per my discussion with patient and FOB last evening, I made the call of a code Caesarean.    Over a low transverse incision was delivered a viable female with Apgars of pending  weighing 580 grams.  There was absolutely no amniotic fluid.  Placenta appeared normal.   Uterus, tubes and ovaries were all normal.  There were no other significant findings  Description of operation:  Patient was taken to the operating room and placed in the sitting position where she underwent a spinal anesthetic. She was then placed in the supine position with tilt to the left side. When adequate anesthetic level was obtained she was prepped and draped in usual sterile fashion and a Foley catheter was placed. A Pfannenstiel skin incision was made and carried down sharply to the rectus fascia which was scored in the midline extended laterally. The fascia was taken off the muscles both superiorly and without difficulty. The muscles were  divided.  The peritoneal cavity was entered.  Bladder blade was placed, no bladder flap was created.  A low vertical hysterotomy incision was made because of the narrowness of lower uterine segment and breech presentation.  I had to extend the incision vertically into the myometrial portion of the uterus in order to provide enough room to deliver the aftercoming head.  delivered a viable female  infant at [id number] with Apgars of pending  weighing 580 grams.  Cord pH was obtained and was 7.3, mixed venous arterial. The uterus was exteriorized. It was closed in 2 layers, the first being a running interlocking layer and the second being an imbricating layer using 0 monocryl on a CTX needle. There was good resulting hemostasis. The uterus tubes and ovaries were all normal. Peritoneal cavity was irrigated vigorously. The muscles and peritoneum were reapproximated loosely. The fascia was closed using 0 Vicryl in running fashion. Subcutaneous tissue was made hemostatic and irrigated.  The skin was closed using 4-0 Vicryl on a Keith needle in a subcuticular fashion.  Dermabond was placed for additional wound integrity and to serve as a barrier. Blood loss for the procedure was 500 cc. The patient received 2 gram of Ancef prophylactically. The patient was taken to the recovery room in good stable condition with all counts being correct x3.  EBL 500 cc  EURE,LUTHER H 01/27/2016 7:56 AM

## 2016-01-28 LAB — CBC
HCT: 22.3 % — ABNORMAL LOW (ref 36.0–46.0)
Hemoglobin: 7.9 g/dL — ABNORMAL LOW (ref 12.0–15.0)
MCH: 29.4 pg (ref 26.0–34.0)
MCHC: 35.4 g/dL (ref 30.0–36.0)
MCV: 82.9 fL (ref 78.0–100.0)
PLATELETS: 205 10*3/uL (ref 150–400)
RBC: 2.69 MIL/uL — AB (ref 3.87–5.11)
RDW: 13.4 % (ref 11.5–15.5)
WBC: 16.3 10*3/uL — AB (ref 4.0–10.5)

## 2016-01-28 LAB — RUBELLA SCREEN: RUBELLA: 3.98 {index} (ref >0.99)

## 2016-01-28 NOTE — Progress Notes (Signed)
CLINICAL SOCIAL WORK MATERNAL/CHILD NOTE  Patient Details  Name: Boy Enid Maultsby MRN: [id number] Date of Birth: 01/27/2017  Date:  01/28/2016  Clinical Social Worker Initiating Note:  Terri Piedra, [place] Date/ Time Initiated:  01/28/16/1330     Child's Name:  Orion Modest   Legal Guardian:  Other (Comment) (Parents: Loistine Simas and Bertram Gala)   Need for Interpreter:  None   Date of Referral:   (No referral-NICU admission)     Reason for Referral:  Late or No Prenatal Care , Parental Support of Premature Babies < 50 weeks/or Critically Ill babies    Referral Source:      Address:  [address] [place], [place] [address]  Phone number:  [phone]   Household Members:  Significant Other   Natural Supports (not living in the home):  Extended Family, Immediate Family (MOB is from [place], [place] and states her family there is involed and supportive.  FOB is from [place], [place] and states his family is also involved and supportive.)   Professional Supports: None   Employment: Student   Type of Work:     Education:  Attending college (MOB is in her last semester at [employer] with a major in Art gallery manager.  FOB is also in his last semester, Statistician and Fitness Management.)   Financial Resources:  [employer]   Other Resources:      Cultural/Religious Considerations Which May Impact Care: None stated.  MOB's facesheet notes religion as [place].  Strengths:  Ability to meet basic needs , Compliance with medical plan    Risk Factors/Current Problems:  None   Cognitive State:  Able to Concentrate , Alert    Mood/Affect:  Calm , Comfortable , Interested    CSW Assessment: CSW met with parents in MOB's third floor room/302 to introduce services, offer support, and complete assessment due to baby's admission to NICU at 29.5 weeks.  FOB was asleep on pullout bed.  MOB stated we could talk about anything with him present.  She was quiet,  but welcoming and receptive to CSW intervention. MOB told CSW about her study abroad to [place] from [place].  She reports feeling "guilty."  CSW explored this further with her.  She states she found out she was pregnant one week prior to her trip and although she was told by a doctor that it was safe to go, she is wondering if she should have taken the trip.  She also states that she saw a doctor in [place] who told her she had a short cervix and she feels she did not have a good understanding of what this meant.  CSW discussed how guilt is an unproductive emotion unless it is an agent for change.  MOB could not identify anything that she had done wrong, therefore, illustrating that she does not have anything to be guilty of.  MOB smiled and seemed to appreciate CSW's encouragement to let go of her guilt.   MOB reports that she has a good support system and that her family lives in [place], [place] and is involved and supportive.  FOB's family is also involved and supportive and live in [place], [place].  Parents are both students at [employer] and live together.  They report being in a relationship for 3-4 years.  Parents state they have not begun to get supplies for infant.  CSW encouraged them to not focus on these needs at this time, as there is time to  prepare, but rather on spending time with baby. CSW informed parents of baby's eligibility to apply for Supplemental Security Income through the [employer].  MOB stated understanding and signed patient access form.  CSW provided her with a copy of baby's Admission Summary.  MOB was appreciative. CSW provided education regarding perinatal mood disorders and stressed the importance of talking with a medical professional if she has concerns at any time.  CSW asked MOB to call CSW any time she feels she would like to process her feelings or schedule a family conference with the medical team.  CSW provided contact information.  CSW  Plan/Description:  Patient/Family Education , Psychosocial Support and Ongoing Assessment of Needs    Alphonzo Cruise, [employer] 01/28/2016, 10:30 PM

## 2016-01-28 NOTE — Progress Notes (Signed)
Subjective: Postpartum Day 1: Cesarean Delivery  Pt without complaints this morning. Tolerating diet, pain controlled, voiding without problems. Breast feeding  Objective: Vital signs in last 24 hours: Temp:  [96.9 F (36.1 C)-98.9 F (37.2 C)] 98.9 F (37.2 C) (01/26 0352) Pulse Rate:  [56-93] 74 (01/26 0352) Resp:  [0-20] 16 (01/26 0352) BP: (94-108)/(42-92) 100/49 (01/26 0352) SpO2:  [97 %-100 %] 99 % (01/26 0352)  Physical Exam:  Lungs clear Heart RRR Abd soft + BS U-2 firm  Incision Drsg intact GU nl lochia Ext SCD's  Recent Labs  01/26/16 1520 01/28/16 0630  HGB 11.3* 7.9*  HCT 32.8* 22.3*    Assessment/Plan: Status post Cesarean section. Doing well postoperatively.  Continue current care.  Hermina StaggersMichael L Deserai Cansler 01/28/2016, 7:00 AM

## 2016-01-28 NOTE — Progress Notes (Signed)
I&O cath performed per protocol d/t pt unable to void post Foley removal. Pt tolerated procedure well. [name], RN present as second person during insertion. 250cc of yellow urine returned. Encourage fluid intake and will continue to monitor for next void

## 2016-01-28 NOTE — Lactation Note (Signed)
This note was copied from a baby's chart. Lactation Consultation Note  Patient Name: Amy Neal  Follow up visit made.  Mom states she is pumping but not obtaining colostrum.  Reassured and instructed to continue pumping every 3 hours.  [facility].  Referral was faxed yesterday.   Maternal Data    Feeding    LATCH Score/Interventions                      Lactation Tools Discussed/Used     Consult Status      [name], Piercen Covino S Neal, 9:32 AM

## 2016-01-29 MED ORDER — IBUPROFEN 600 MG PO TABS: 600.0000 mg | ORAL_TABLET | Freq: Four times a day (QID) | ORAL | 2 refills | Status: DC | PRN

## 2016-01-29 MED ORDER — DOCUSATE SODIUM 100 MG PO CAPS: 100.0000 mg | ORAL_CAPSULE | Freq: Two times a day (BID) | ORAL | 2 refills | Status: DC | PRN

## 2016-01-29 MED ORDER — OXYCODONE-ACETAMINOPHEN 5-325 MG PO TABS: 1.0000 | ORAL_TABLET | ORAL | 0 refills | Status: DC | PRN

## 2016-01-29 NOTE — Discharge Summary (Signed)
OB Discharge Summary     Patient Name: Amy Neal DOB: 02/14/1995 MRN: [id number]  Date of admission: 01/26/2016 Delivering MD: Amy Neal, Amy Neal   Date of discharge: 01/29/2016  Admitting diagnosis: 29WKS, DECREASED FM,SEVERE CRAMPS Intrauterine pregnancy: 9374w0d     Secondary diagnosis:  Active Problems:   Bradycardia found on measurement of baseline fetal heart rate  Additional problems: IUGR fetus, oligohydramnios, and abnormal dopplers.      Discharge diagnosis: Preterm Pregnancy Delivered                                                                                                Post partum procedures:None  Augmentation: None  Complications: None  Hospital course:  Induction of Labor With Cesarean Section  21 y.o. yo G1P0 at 3434w4d was admitted to the hospital 01/26/2016 for IUGR fetus, oligohydramnios, and abnormal dopplers.  She almost underwent emergent cesarean section from MAU for bradycardia, but was stabilized and ultrasound showed findings as above. She was admitted and monitor continuously.  NICU consulted.  Betamethasone and Magnesium sulfate given for neuro-pryophylaxis. Later in the day, she has a another significant bradycardia  and went for urgent cesarean section.  She delivered a Viable infant in critical condition at7:12 AM ,01/27/2016  . Details of operation can be found in separate operative Note.  Patient had an uncomplicated postpartum course. She is ambulating, tolerating a regular diet, passing flatus, and urinating well.  Patient is discharged home in stable condition on 01/29/16. Baby's course remained critical and [facility]'s [facility].                                    Physical exam  Vitals:   01/28/16 0352 01/28/16 0827 01/28/16 2008 01/29/16 0800  BP: (!) 100/49 (!) 103/44 115/65 (!) 99/59  Pulse: 74 61 96 60  Resp: 16 16 16 16   Temp: 98.9 F (37.2 C) 98.8 F (37.1 C) 99.5 F (37.5 C)  98.9 F (37.2 C)  TempSrc: Oral Oral Oral Oral  SpO2: 99% 100% 100% 100%  Weight:      Height:       General: alert and no distress Lochia: appropriate Uterine Fundus: firm Incision: Healing well with no significant drainage, No significant erythema, Dressing is clean, dry, and intact DVT Evaluation: No evidence of DVT seen on physical exam. Negative Homan's sign. No cords or calf tenderness. Labs: Lab Results  Component Value Date   WBC 16.3 (Neal) 01/28/2016   HGB 7.9 (L) 01/28/2016   HCT 22.3 (L) 01/28/2016   MCV 82.9 01/28/2016   PLT 205 01/28/2016   CMP Latest Ref Rng & Units 02/25/2015  Glucose 65 - 99 mg/dL 72  BUN 6 - 20 mg/dL 9  Creatinine 8.290.44 - 5.621.00 mg/dL 1.300.80  Sodium 865135 - 784145 mmol/L 143  Potassium 3.5 - 5.1 mmol/L 3.9  Chloride 101 - 111 mmol/L 105  CO2 22 - 32 mmol/L 27  Calcium 8.9 - 10.3 mg/dL 9.6  Total Protein 6.5 - 8.1 g/dL 7.4  Total Bilirubin 0.3 - 1.2 mg/dL <4.0(J)  Alkaline Phos 38 - 126 U/L 57  AST 15 - 41 U/L 21  ALT 14 - 54 U/L 13(L)    Discharge instruction: per After Visit Summary and "Baby and Me Booklet".  After visit meds:  Allergies as of 01/29/2016      Reactions   Shrimp [shellfish Allergy] Hives   TONGUE SWELLING, THROAT ITCHING      Medication List    STOP taking these medications   ciprofloxacin 500 MG tablet Commonly known as:  CIPRO   mefloquine 250 MG tablet Commonly known as:  LARIAM     TAKE these medications   docusate sodium 100 MG capsule Commonly known as:  COLACE Take 1 capsule (100 mg total) by mouth 2 (two) times daily as needed.   ibuprofen 600 MG tablet Commonly known as:  ADVIL,MOTRIN Take 1 tablet (600 mg total) by mouth every 6 (six) hours as needed for mild pain.   oxyCODONE-acetaminophen 5-325 MG tablet Commonly known as:  ROXICET Take 1-2 tablets by mouth every 4 (four) hours as needed. What changed:  how much to take   prenatal multivitamin Tabs tablet Take 1 tablet by mouth daily at 12  noon.   typhoid DR capsule Commonly known as:  VIVOTIF Take 1 capsule by mouth every other day. Keep refrigerated       Diet: routine diet  Activity: Advance as tolerated. Pelvic rest for 6 weeks.   Outpatient follow up:4 weeks Follow up Appt:No future appointments. Follow up Visit:No Follow-up on file.  Postpartum contraception: Nexplanon  Newborn Data: Live born female  Birth Weight: 1 lb 4.5 oz (580 g) APGAR: 2, 3  Baby Feeding: Bottle Disposition:NICU   01/29/2016 Amy Collins, MD

## 2016-01-29 NOTE — Lactation Note (Signed)
This note was copied from a baby's chart. Lactation Consultation Note  Patient Name: Boy Jaclene Bartelt BPZWC'H Date: 01/29/2016 Reason for consult: Follow-up assessment;NICU baby  NICU baby 29 hours old. Mom reports that she is pumping every 2-3 hours and is now collecting colostrum. Mom reports that she took EBM to NICU. Mom given paperwork for [facility] loaner, and expects to probably be discharged 01-30-16. Mom aware of pumping rooms in NICU and knows to take kit when she discharges to use at home with loaner and in NICU pumping rooms.   Maternal Data    Feeding    LATCH Score/Interventions                      Lactation Tools Discussed/Used     Consult Status Consult Status: Follow-up Date: 01/30/16 Follow-up type: In-patient    Andres Labrum 01/29/2016, 3:22 PM

## 2016-01-29 NOTE — Discharge Instructions (Signed)

## 2016-01-30 DIAGNOSIS — Z3A24 24 weeks gestation of pregnancy: Secondary | ICD-10-CM

## 2016-01-30 DIAGNOSIS — O368130 Decreased fetal movements, third trimester, not applicable or unspecified: Secondary | ICD-10-CM

## 2016-01-30 DIAGNOSIS — O4102X0 Oligohydramnios, second trimester, not applicable or unspecified: Secondary | ICD-10-CM

## 2016-01-30 NOTE — Discharge Summary (Signed)
OB Discharge Summary     Patient Name: Amy Neal DOB: 08-Jul-1995 MRN: [id number]  Date of admission: 01/26/2016 Delivering MD: Duane Lope H   Date of discharge: 01/30/2016  Admitting diagnosis: 29WKS, DECREASED FM,SEVERE CRAMPS Intrauterine pregnancy: [redacted]w[redacted]d     Secondary diagnosis:  Active Problems:   Bradycardia found on measurement of baseline fetal heart rate  Additional problems: IUGR fetus, oligohydramnios, and abnormal dopplers.      Discharge diagnosis: Preterm Pregnancy Delivered                                                                                                Post partum procedures:None  Augmentation: None  Complications: None  Hospital course:  Induction of Labor With Cesarean Section  21 y.o. yo G1P0 at [redacted]w[redacted]d was admitted to the hospital 01/26/2016 for IUGR fetus, oligohydramnios, and abnormal dopplers.  She almost underwent emergent cesarean section from MAU for bradycardia, but was stabilized and ultrasound showed findings as above. She was admitted and monitor continuously.  NICU consulted.  Betamethasone and Magnesium sulfate given for neuro-pryophylaxis. Later in the day, she has a another significant bradycardia  and went for urgent cesarean section.  She delivered a Viable infant in critical condition at7:12 AM ,01/27/2016  . Details of operation can be found in separate operative Note.  Patient had an uncomplicated postpartum course. She is ambulating, tolerating a regular diet, passing flatus, and urinating well.  Patient is discharged home in stable condition on 01/30/16. Baby's course remains critical and was not transferred to [facility] as originally planned on 01/29/2016.                                    Physical exam  Vitals:   01/28/16 0827 01/28/16 2008 01/29/16 0800 01/29/16 2037  BP: (!) 103/44 115/65 (!) 99/59 (!) 101/52  Pulse: 61 96 60 82  Resp: 16 16 16 15   Temp: 98.8 F (37.1 C) 99.5 F (37.5 C) 98.9 F (37.2 C) 98.7 F  (37.1 C)  TempSrc: Oral Oral Oral Oral  SpO2: 100% 100% 100% 98%  Weight:      Height:       General: alert and no distress Lochia: appropriate Uterine Fundus: firm Incision: Healing well with no significant drainage, No significant erythema, Dressing is clean, dry, and intact DVT Evaluation: No evidence of DVT seen on physical exam. Negative Homan's sign. No cords or calf tenderness. Labs: Lab Results  Component Value Date   WBC 16.3 (H) 01/28/2016   HGB 7.9 (L) 01/28/2016   HCT 22.3 (L) 01/28/2016   MCV 82.9 01/28/2016   PLT 205 01/28/2016   CMP Latest Ref Rng & Units 02/25/2015  Glucose 65 - 99 mg/dL 72  BUN 6 - 20 mg/dL 9  Creatinine 4.09 - 8.11 mg/dL 9.14  Sodium 782 - 956 mmol/L 143  Potassium 3.5 - 5.1 mmol/L 3.9  Chloride 101 - 111 mmol/L 105  CO2 22 - 32 mmol/L 27  Calcium 8.9 - 10.3 mg/dL 9.6  Total Protein 6.5 - 8.1 g/dL 7.4  Total Bilirubin 0.3 - 1.2 mg/dL <1.6(X<0.1(L)  Alkaline Phos 38 - 126 U/L 57  AST 15 - 41 U/L 21  ALT 14 - 54 U/L 13(L)    Discharge instruction: per After Visit Summary and "Baby and Me Booklet".  After visit meds:  Allergies as of 01/30/2016      Reactions   Shrimp [shellfish Allergy] Hives   TONGUE SWELLING, THROAT ITCHING      Medication List    STOP taking these medications   ciprofloxacin 500 MG tablet Commonly known as:  CIPRO   mefloquine 250 MG tablet Commonly known as:  LARIAM     TAKE these medications   docusate sodium 100 MG capsule Commonly known as:  COLACE Take 1 capsule (100 mg total) by mouth 2 (two) times daily as needed.   ibuprofen 600 MG tablet Commonly known as:  ADVIL,MOTRIN Take 1 tablet (600 mg total) by mouth every 6 (six) hours as needed for mild pain.   oxyCODONE-acetaminophen 5-325 MG tablet Commonly known as:  ROXICET Take 1-2 tablets by mouth every 4 (four) hours as needed. What changed:  how much to take   prenatal multivitamin Tabs tablet Take 1 tablet by mouth daily at 12 noon.    typhoid DR capsule Commonly known as:  VIVOTIF Take 1 capsule by mouth every other day. Keep refrigerated       Diet: routine diet  Activity: Advance as tolerated. Pelvic rest for 6 weeks.   Outpatient follow up:4 weeks Follow up Appt:No future appointments. Follow up Visit:No Follow-up on file.  Postpartum contraception: Nexplanon  Newborn Data: Live born female  Birth Weight: 1 lb 4.5 oz (580 g) APGAR: 2, 3  Baby Feeding: Bottle Disposition:NICU   01/30/2016 Catalina AntiguaPeggy Leslee Suire, MD

## 2016-01-30 NOTE — Progress Notes (Signed)
Discharge instructions reviewed with patient.  Patient states understanding of home care, medications, activity, signs/symptoms to report to MD and return MD office visit.  Patients significant other and family will assist with her care @ home.  No home  equipment needed, patient has prescriptions and all personal belongings.  Patient discharged via wheelchair in stable condition with staff without incident.  

## 2016-03-06 ENCOUNTER — Ambulatory Visit: Payer: Medicaid Other | Admitting: Medical

## 2016-03-06 ENCOUNTER — Encounter: Payer: Self-pay | Admitting: *Deleted

## 2016-03-06 NOTE — Progress Notes (Signed)
Amy Neal missed a scheduled postpartum visit. Will send letter to notify her.

## 2016-03-22 ENCOUNTER — Ambulatory Visit (INDEPENDENT_AMBULATORY_CARE_PROVIDER_SITE_OTHER): Payer: Medicaid Other | Admitting: Advanced Practice Midwife

## 2016-03-22 ENCOUNTER — Ambulatory Visit (INDEPENDENT_AMBULATORY_CARE_PROVIDER_SITE_OTHER): Payer: Self-pay | Admitting: Clinical

## 2016-03-22 ENCOUNTER — Other Ambulatory Visit (HOSPITAL_COMMUNITY)
Admission: RE | Admit: 2016-03-22 | Discharge: 2016-03-22 | Disposition: A | Discharge status: Home / Self Care | Payer: Medicaid Other | Source: Ambulatory Visit | Attending: Obstetrics & Gynecology | Admitting: Obstetrics & Gynecology

## 2016-03-22 ENCOUNTER — Encounter: Payer: Self-pay | Admitting: Advanced Practice Midwife

## 2016-03-22 VITALS — BP 102/67 | HR 60 | Wt 160.5 lb

## 2016-03-22 DIAGNOSIS — Z98891 History of uterine scar from previous surgery: Secondary | ICD-10-CM | POA: Insufficient documentation

## 2016-03-22 DIAGNOSIS — Z3202 Encounter for pregnancy test, result negative: Secondary | ICD-10-CM | POA: Diagnosis not present

## 2016-03-22 DIAGNOSIS — Z124 Encounter for screening for malignant neoplasm of cervix: Secondary | ICD-10-CM

## 2016-03-22 DIAGNOSIS — Z01419 Encounter for gynecological examination (general) (routine) without abnormal findings: Secondary | ICD-10-CM | POA: Diagnosis not present

## 2016-03-22 DIAGNOSIS — F4320 Adjustment disorder, unspecified: Secondary | ICD-10-CM

## 2016-03-22 DIAGNOSIS — F4321 Adjustment disorder with depressed mood: Secondary | ICD-10-CM

## 2016-03-22 DIAGNOSIS — O09299 Supervision of pregnancy with other poor reproductive or obstetric history, unspecified trimester: Secondary | ICD-10-CM | POA: Insufficient documentation

## 2016-03-22 LAB — POCT PREGNANCY, URINE: Preg Test, Ur: NEGATIVE

## 2016-03-22 NOTE — Progress Notes (Signed)
Subjective:     Amy Neal is a 21 y.o. female who presents for a postpartum visit. She is 8 weeks postpartum following a fundal vertical Cesarean section. I have fully reviewed the prenatal and intrapartum course. The delivery was at 29 gestational weeks. Outcome: primary cesarean section, classical incision. Anesthesia: regional. Postpartum course has been remarkable for normal healing, menses returned last week, and appropriate grieving.  Patient is finishing her college degree next month.. Baby's course has been remarkable for neonatal death 4 days after birth.   Bleeding no bleeding. Bowel function is normal. Bladder function is normal. Patient is sexually active. Contraception method is Nexplanon and has had unprotected sex. Postpartum depression screening: negative.  But we will have Asher MuirJamie see her anyway.  The following portions of the patient's history were reviewed and updated as appropriate: allergies, current medications, past family history, past medical history, past social history, past surgical history and problem list.  Review of Systems Nexplanon and but has had unprotected sex, will need to come back   Objective:    BP 102/67   Pulse 60   Wt 160 lb 8 oz (72.8 kg)   LMP 03/11/2016 (Exact Date)   Breastfeeding? No   BMI 27.55 kg/m   General:  alert, cooperative and no distress   Breasts:  inspection negative, no nipple discharge or bleeding, no masses or nodularity palpable  Lungs: clear to auscultation bilaterally  Heart:  regular rate and rhythm, S1, S2 normal, no murmur, click, rub or gallop  Abdomen: soft, non-tender; bowel sounds normal; no masses,  no organomegaly   C/S (low transverse skin scar) healed well   Vulva:  normal  Vagina: normal vagina, no discharge, exudate, lesion, or erythema  Cervix:  no cervical motion tenderness  Corpus: normal size, contour, position, consistency, mobility, non-tender  Adnexa:  no mass, fullness, tenderness  Rectal Exam: Not  performed.        Assessment:     Normal  postpartum exam. Pap smear done at today's visit.   Plan:    1. Contraception: Nexplanon 2. Will have her use condoms and return for nexplanon 3. Follow up in: 2 weeks or as needed.

## 2016-03-22 NOTE — Patient Instructions (Signed)
Etonogestrel implant What is this medicine? ETONOGESTREL (et oh noe JES trel) is a contraceptive (birth control) device. It is used to prevent pregnancy. It can be used for up to 3 years. This medicine may be used for other purposes; ask your health care provider or pharmacist if you have questions. COMMON BRAND NAME(S): Implanon, Nexplanon What should I tell my health care provider before I take this medicine? They need to know if you have any of these conditions: -abnormal vaginal bleeding -blood vessel disease or blood clots -cancer of the breast, cervix, or liver -depression -diabetes -gallbladder disease -headaches -heart disease or recent heart attack -high blood pressure -high cholesterol -kidney disease -liver disease -renal disease -seizures -tobacco smoker -an unusual or allergic reaction to etonogestrel, other hormones, anesthetics or antiseptics, medicines, foods, dyes, or preservatives -pregnant or trying to get pregnant -breast-feeding How should I use this medicine? This device is inserted just under the skin on the inner side of your upper arm by a health care professional. Talk to your pediatrician regarding the use of this medicine in children. Special care may be needed. Overdosage: If you think you have taken too much of this medicine contact a poison control center or emergency room at once. NOTE: This medicine is only for you. Do not share this medicine with others. What if I miss a dose? This does not apply. What may interact with this medicine? Do not take this medicine with any of the following medications: -amprenavir -bosentan -fosamprenavir This medicine may also interact with the following medications: -barbiturate medicines for inducing sleep or treating seizures -certain medicines for fungal infections like ketoconazole and itraconazole -grapefruit juice -griseofulvin -medicines to treat seizures like carbamazepine, felbamate, oxcarbazepine,  phenytoin, topiramate -modafinil -phenylbutazone -rifampin -rufinamide -some medicines to treat HIV infection like atazanavir, indinavir, lopinavir, nelfinavir, tipranavir, ritonavir -St. John's wort This list may not describe all possible interactions. Give your health care provider a list of all the medicines, herbs, non-prescription drugs, or dietary supplements you use. Also tell them if you smoke, drink alcohol, or use illegal drugs. Some items may interact with your medicine. What should I watch for while using this medicine? This product does not protect you against HIV infection (AIDS) or other sexually transmitted diseases. You should be able to feel the implant by pressing your fingertips over the skin where it was inserted. Contact your doctor if you cannot feel the implant, and use a non-hormonal birth control method (such as condoms) until your doctor confirms that the implant is in place. If you feel that the implant may have broken or become bent while in your arm, contact your healthcare provider. What side effects may I notice from receiving this medicine? Side effects that you should report to your doctor or health care professional as soon as possible: -allergic reactions like skin rash, itching or hives, swelling of the face, lips, or tongue -breast lumps -changes in emotions or moods -depressed mood -heavy or prolonged menstrual bleeding -pain, irritation, swelling, or bruising at the insertion site -scar at site of insertion -signs of infection at the insertion site such as fever, and skin redness, pain or discharge -signs of pregnancy -signs and symptoms of a blood clot such as breathing problems; changes in vision; chest pain; severe, sudden headache; pain, swelling, warmth in the leg; trouble speaking; sudden numbness or weakness of the face, arm or leg -signs and symptoms of liver injury like dark yellow or brown urine; general ill feeling or flu-like symptoms;  light-colored   stools; loss of appetite; nausea; right upper belly pain; unusually weak or tired; yellowing of the eyes or skin -unusual vaginal bleeding, discharge -signs and symptoms of a stroke like changes in vision; confusion; trouble speaking or understanding; severe headaches; sudden numbness or weakness of the face, arm or leg; trouble walking; dizziness; loss of balance or coordination Side effects that usually do not require medical attention (report to your doctor or health care professional if they continue or are bothersome): -acne -back pain -breast pain -changes in weight -dizziness -general ill feeling or flu-like symptoms -headache -irregular menstrual bleeding -nausea -sore throat -vaginal irritation or inflammation This list may not describe all possible side effects. Call your doctor for medical advice about side effects. You may report side effects to FDA at 1-800-FDA-1088. Where should I keep my medicine? This drug is given in a hospital or clinic and will not be stored at home. NOTE: This sheet is a summary. It may not cover all possible information. If you have questions about this medicine, talk to your doctor, pharmacist, or health care provider.  2018 Elsevier/Gold Standard (2015-07-08 11:19:22) Pap Test Why am I having this test? A pap test is sometimes called a pap smear. It is a screening test that is used to check for signs of cancer of the vagina, cervix, and uterus. The test can also identify the presence of infection or precancerous changes. Your health care provider will likely recommend you have this test done on a regular basis. This test may be done:  Every 3 years, starting at age 22.  Every 5 years, in combination with testing for the presence of human papillomavirus (HPV).  More or less often depending on other medical conditions. What kind of sample is taken? Using a small cotton swab, plastic spatula, or brush, your health care provider will  collect a sample of cells from the surface of your cervix. Your cervix is the opening to your uterus, also called a womb. Secretions from the cervix and vagina may also be collected. How do I prepare for this test?  Be aware of where you are in your menstrual cycle. You may be asked to reschedule the test if you are menstruating on the day of the test.  You may need to reschedule if you have a known vaginal infection on the day of the test.  You may be asked to avoid douching or taking a bath the day before or the day of the test.  Some medicines can cause abnormal test results, such as digitalis and tetracycline. Talk with your health care provider before your test if you take one of these medicines. What do the results mean? Abnormal test results may indicate a number of health conditions. These may include:  Cancer. Although pap test results cannot be used to diagnose cancer of the cervix, vagina, or uterus, they may suggest the possibility of cancer. Further tests would be required to determine if cancer is present.  Sexually transmitted disease.  Fungal infection.  Parasite infection.  Herpes infection.  A condition causing or contributing to infertility. It is your responsibility to obtain your test results. Ask the lab or department performing the test when and how you will get your results. Contact your health care provider to discuss any questions you have about your results. Talk with your health care provider to discuss your results, treatment options, and if necessary, the need for more tests. Talk with your health care provider if you have any questions about your  results. This information is not intended to replace advice given to you by your health care provider. Make sure you discuss any questions you have with your health care provider. Document Released: 03/11/2002 Document Revised: 08/25/2015 Document Reviewed: 05/12/2013 Elsevier Interactive Patient Education  2017  [employer].

## 2016-03-22 NOTE — Progress Notes (Signed)
error 

## 2016-03-22 NOTE — BH Specialist Note (Signed)
Integrated Behavioral Health Initial Visit  MRN: [id number] Name: Amy Neal   Session Start time: 3:15 Session End time: 3:35 Total time: 20 minutes  Type of Service: Integrated Behavioral Health- Individual/Family Interpretor:No. Interpretor Name and Language: n/a   Warm Hand Off Completed.       SUBJECTIVE: Amy Neal is a 21 y.o. female accompanied by patient. Patient was referred by Wynelle BourgeoisMarie Williams, CNM for grief, stress Patient reports the following symptoms/concerns: Pt states that she feels she is doing better each week that goes by, but that she would like to find out community counseling options for the future, in case she needs to utilize at a later date. Pt looks forward to graduation, her goals keep her motivated, and she might like to take some fitness classes upon graduating to help continue to cope. Duration of problem: Less than 2 months; Severity of problem: moderate  OBJECTIVE: Mood: Appropriate and Affect: Appropriate Risk of harm to self or others: No plan to harm self or others   LIFE CONTEXT: Family and Social: Lives with boyfriend School/Work: Therapist, sportsulltime student at [employer] Self-Care: Setting and meeting goals Life Changes: Recent loss of 1074-day-old newborn  GOALS ADDRESSED: Patient will reduce symptoms of: depression and increase knowledge and/or ability of: self-management skills and also: Increase healthy adjustment to current life circumstances   INTERVENTIONS: Motivational Interviewing, Psychoeducation and/or Health Education and Link to [facility] Resources  Standardized Assessments completed: GAD-7 and PHQ 9  ASSESSMENT: Patient currently experiencing Grief. Patient may benefit from psychoeducation and brief therapeutic intervention regarding coping with symptoms of depression related to grief, along with community resources.  PLAN: 1. Follow up with behavioral health clinician on : As needed 2. Behavioral recommendations:   -Consider [facility] for continued grief support -Look up [employer] classes online -Scientist, product/process developmentead educational material regarding coping with symptoms of anxiety and depression -Consider calming apps as self-coping stategy, as needed -Consider [facility], if counseling is needed in the future 3. Referral(s): Integrated Art gallery managerBehavioral Health Services (In Clinic) and [facility] Resources:  [facility], [facility], [employer] 4. "From scale of 1-10, how likely are you to follow plan?": 9  Valetta CloseJamie C Bernarr Longsworth, LCSWA    Depression screen [facility] 2/9 03/22/2016  Decreased Interest 0  Down, Depressed, Hopeless 1  PHQ - 2 Score 1  Altered sleeping 0  Tired, decreased energy 1  Change in appetite 3  Feeling bad or failure about yourself  0  Trouble concentrating 1  Moving slowly or fidgety/restless 3  Suicidal thoughts 0  PHQ-9 Score 9   GAD 7 : Generalized Anxiety Score 03/22/2016  Nervous, Anxious, on Edge 3  Control/stop worrying 1  Worry too much - different things 1  Trouble relaxing 0  Restless 0  Easily annoyed or irritable 0  Afraid - awful might happen 1  Total GAD 7 Score 6

## 2016-03-24 LAB — CYTOLOGY - PAP: DIAGNOSIS: NEGATIVE

## 2016-04-06 ENCOUNTER — Ambulatory Visit: Payer: Self-pay | Admitting: Obstetrics and Gynecology

## 2016-06-09 ENCOUNTER — Encounter (HOSPITAL_COMMUNITY): Payer: Self-pay | Admitting: Obstetrics & Gynecology

## 2016-06-09 NOTE — Addendum Note (Signed)
Addendum  created 06/09/16 0930 by Kally Cadden, MD   Sign clinical note    

## 2016-10-12 ENCOUNTER — Ambulatory Visit: Payer: Self-pay

## 2016-11-20 ENCOUNTER — Ambulatory Visit (INDEPENDENT_AMBULATORY_CARE_PROVIDER_SITE_OTHER): Payer: BLUE CROSS/BLUE SHIELD | Admitting: Advanced Practice Midwife

## 2016-11-20 ENCOUNTER — Other Ambulatory Visit (HOSPITAL_COMMUNITY)
Admission: RE | Admit: 2016-11-20 | Discharge: 2016-11-20 | Disposition: A | Discharge status: Home / Self Care | Payer: BLUE CROSS/BLUE SHIELD | Source: Ambulatory Visit | Attending: Advanced Practice Midwife | Admitting: Advanced Practice Midwife

## 2016-11-20 ENCOUNTER — Encounter: Payer: Self-pay | Admitting: Advanced Practice Midwife

## 2016-11-20 VITALS — BP 124/67 | HR 74 | Ht 64.0 in | Wt 130.0 lb

## 2016-11-20 DIAGNOSIS — Z3202 Encounter for pregnancy test, result negative: Secondary | ICD-10-CM

## 2016-11-20 DIAGNOSIS — Z3046 Encounter for surveillance of implantable subdermal contraceptive: Secondary | ICD-10-CM

## 2016-11-20 DIAGNOSIS — Z113 Encounter for screening for infections with a predominantly sexual mode of transmission: Secondary | ICD-10-CM | POA: Insufficient documentation

## 2016-11-20 DIAGNOSIS — Z30017 Encounter for initial prescription of implantable subdermal contraceptive: Secondary | ICD-10-CM

## 2016-11-20 LAB — POCT PREGNANCY, URINE: Preg Test, Ur: NEGATIVE

## 2016-11-20 MED ORDER — ETONOGESTREL 68 MG ~~LOC~~ IMPL
68.0000 mg | DRUG_IMPLANT | Freq: Once | SUBCUTANEOUS | Status: AC
  Administered 2016-11-20: 68 mg via SUBCUTANEOUS

## 2016-11-20 NOTE — Progress Notes (Signed)
Patient had elevated gad7 today. Declines seeing Asher MuirJamie today, but is interested in appt with her for later day. Thressa ShellerHeather Hogan aware

## 2016-11-20 NOTE — Progress Notes (Signed)
Subjective:     Patient ID: Amy Neal, female   DOB: 03/01/1995, 21 y.o.   MRN: [id number]  Amy Neal is a 21 y.o. G1P0100 who presents today for Nexplanon insertion and STD testing. She denies any complaints of symptoms today. She has been considering Nexplanon for a few months, and is ready at this time.    Gynecologic Exam  The patient's pertinent negatives include no genital itching, pelvic pain or vaginal discharge. This is a new problem. The patient is experiencing no pain. She is not pregnant. Pertinent negatives include no chills, fever, nausea or vomiting. She is sexually active. It is possible that her partner has an STD. She uses nothing for contraception. Her menstrual history has been regular (LMP 11/09/16, no unprotected intercourse since LMP). Her past medical history is significant for a Cesarean section.     Review of Systems  Constitutional: Negative for chills and fever.  Gastrointestinal: Negative for nausea and vomiting.  Genitourinary: Negative for pelvic pain and vaginal discharge.      GYNECOLOGY OFFICE PROCEDURE NOTE  Eudora A Bruna Pottereague is a 21 y.o. G1P0 here for Nexplanon insertion.  Last pap smear was on 03/22/16  and was normal.  No other gynecologic concerns.  Nexplanon Insertion Procedure Patient identified, informed consent performed, consent signed.   Patient does understand that irregular bleeding is a very common side effect of this medication. She was advised to have backup contraception for one week after placement. Pregnancy test in clinic today was negative.  Appropriate time out taken.  Patient's left arm was prepped and draped in the usual sterile fashion. The ruler used to measure and mark insertion area.  Patient was prepped with alcohol swab and then injected with 3 ml of 1% lidocaine.  She was prepped with betadine, Nexplanon removed from packaging,  Device confirmed in needle, then inserted full length of needle and withdrawn per handbook  instructions. Nexplanon was able to palpated in the patient's arm; patient palpated the insert herself. There was minimal blood loss.  Patient insertion site covered with guaze and a pressure bandage to reduce any bruising.  The patient tolerated the procedure well and was given post procedure instructions.    Thressa ShellerHeather Hogan 10:02 AM 11/20/16    Objective:   Physical Exam  Constitutional: She appears well-developed and well-nourished. No distress.  HENT:  Head: Normocephalic.  Cardiovascular: Normal rate.  Pulmonary/Chest: Effort normal.  Abdominal: Soft. There is no tenderness.  Neurological: She is alert.  Skin: Skin is warm.  Psychiatric: She has a normal mood and affect.  Nursing note and vitals reviewed.      Results for orders placed or performed in visit on 11/20/16 (from the past 24 hour(s))  Pregnancy, urine POC     Status: None   Collection Time: 11/20/16  9:56 AM  Result Value Ref Range   Preg Test, Ur NEGATIVE NEGATIVE    Assessment:     1. Screening examination for STD (sexually transmitted disease)   2. Encounter for initial prescription of implantable subdermal contraceptive        Plan:     FU as needed GC/CT pending  Mychart text message sent for patient to sign up Will send results when available.   Thressa ShellerHeather Hogan 10:15 AM 11/20/16

## 2016-11-20 NOTE — Patient Instructions (Signed)
Nexplanon Instructions After Insertion  Keep bandage clean and dry for 24 hours  May use ice/Tylenol/Ibuprofen for soreness or pain  If you develop fever, drainage or increased warmth from incision site-contact office immediately   

## 2016-11-22 LAB — CERVICOVAGINAL ANCILLARY ONLY
Bacterial vaginitis: NEGATIVE
CANDIDA VAGINITIS: NEGATIVE
Trichomonas: NEGATIVE

## 2016-11-29 ENCOUNTER — Institutional Professional Consult (permissible substitution): Payer: Self-pay

## 2016-11-29 NOTE — BH Specialist Note (Deleted)
Integrated Behavioral Health Initial Visit  MRN: [id number] Name: Amy Neal  Number of Integrated Behavioral Health Clinician visits:: 3/6  Session Start time: ***  Session End time: *** Total time: {IBH Total Time:21014050}  Type of Service: Integrated Behavioral Health- Individual/Family Interpretor:No. Interpretor Name and Language: n/a   Warm Hand Off Completed.       SUBJECTIVE: Amy Neal is a 21 y.o. female accompanied by {CHL AMB ACCOMPANIED WU:[id number]}BY:[phone]} Patient was referred by *** for ***. Patient reports the following symptoms/concerns: *** Duration of problem: ***; Severity of problem: {Mild/Moderate/Severe:20260}  OBJECTIVE: Mood: {BHH MOOD:22306} and Affect: {BHH AFFECT:22307} Risk of harm to self or others: {CHL AMB BH Suicide Current Mental Status:21022748}  LIFE CONTEXT: Family and Social: *** School/Work: *** Self-Care: *** Life Changes: ***  GOALS ADDRESSED: Patient will: 1. Reduce symptoms of: {IBH Symptoms:21014056} 2. Increase knowledge and/or ability of: {IBH Patient Tools:21014057}  3. Demonstrate ability to: {IBH Goals:21014053}  INTERVENTIONS: Interventions utilized: {IBH Interventions:21014054}  Standardized Assessments completed: {IBH Screening Tools:21014051}  ASSESSMENT: Patient currently experiencing ***.   Patient may benefit from ***.  PLAN: 1. Follow up with behavioral health clinician on : *** 2. Behavioral recommendations: *** 3. Referral(s): {IBH Referrals:21014055} 4. "From scale of 1-10, how likely are you to follow plan?": ***  Amy LipsJamie C Karma Ansley, LCSW  Depression screen [facility] 2/9 11/20/2016 03/22/2016  Decreased Interest 1 0  Down, Depressed, Hopeless 0 1  PHQ - 2 Score 1 1  Altered sleeping 1 0  Tired, decreased energy 3 1  Change in appetite 2 3  Feeling bad or failure about yourself  0 0  Trouble concentrating 0 1  Moving slowly or fidgety/restless 1 3  Suicidal thoughts 0 0  PHQ-9 Score 8 9   GAD 7  : Generalized Anxiety Score 11/20/2016 03/22/2016  Nervous, Anxious, on Edge 3 3  Control/stop worrying 3 1  Worry too much - different things 3 1  Trouble relaxing 3 0  Restless 0 0  Easily annoyed or irritable 1 0  Afraid - awful might happen 1 1  Total GAD 7 Score 14 6

## 2019-01-16 IMAGING — US US MFM OB COMP +14 WKS
1 series · 14 of 28 positions shown · non-contrast
Comparison: none

[Series 1: us mfm ob comp +14 wks · 76 acquisitions, 14 frames shown]
[im 3/76]
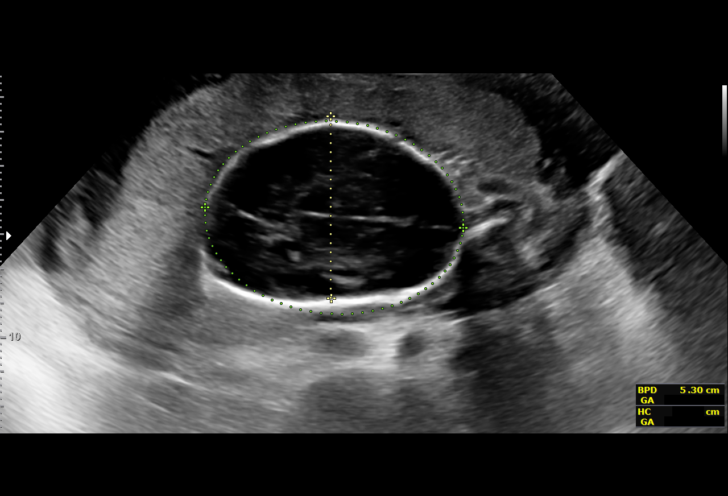
[im 9/76]
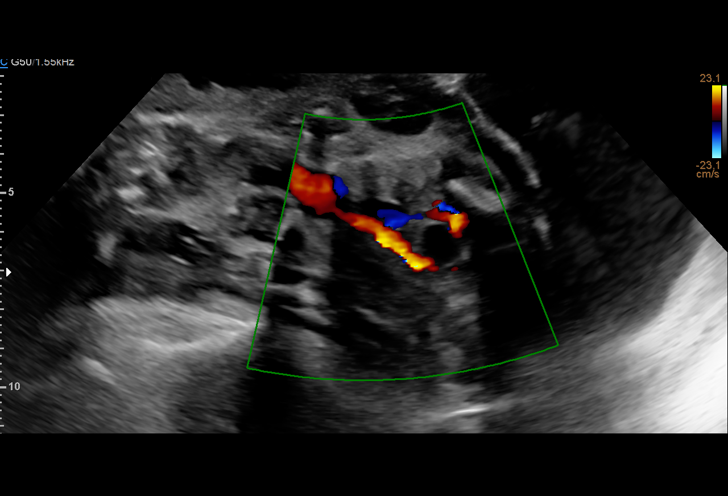
[im 14/76]
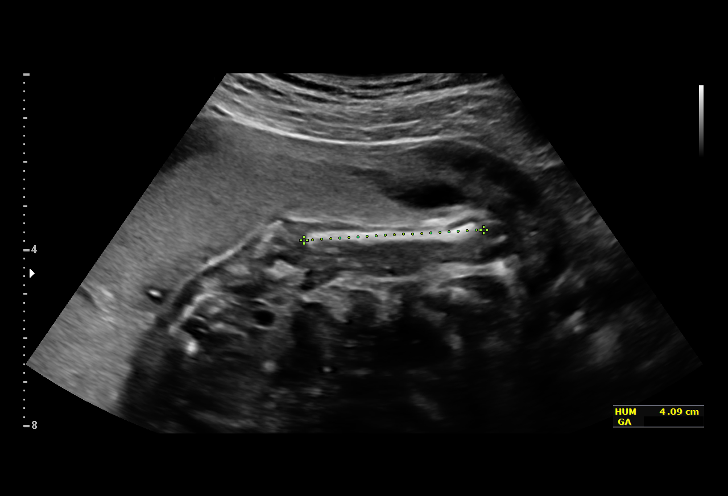
[im 20/76]
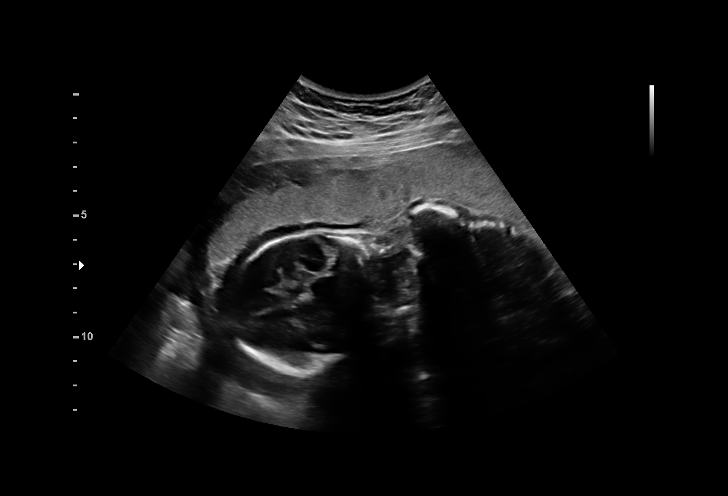
[im 26/76]
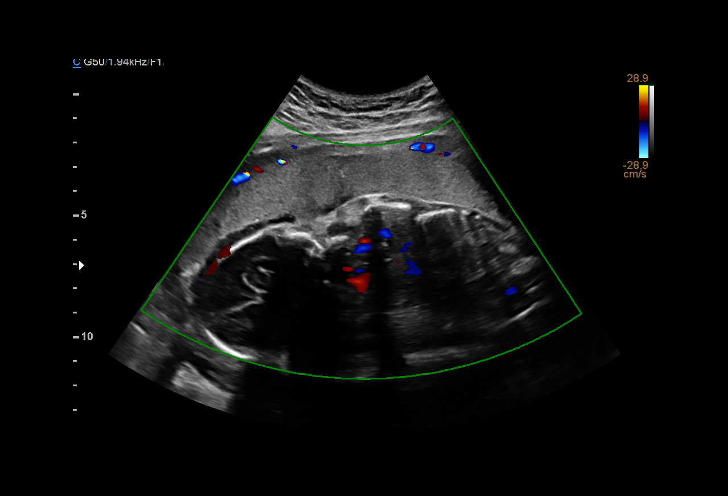
[im 31/76]
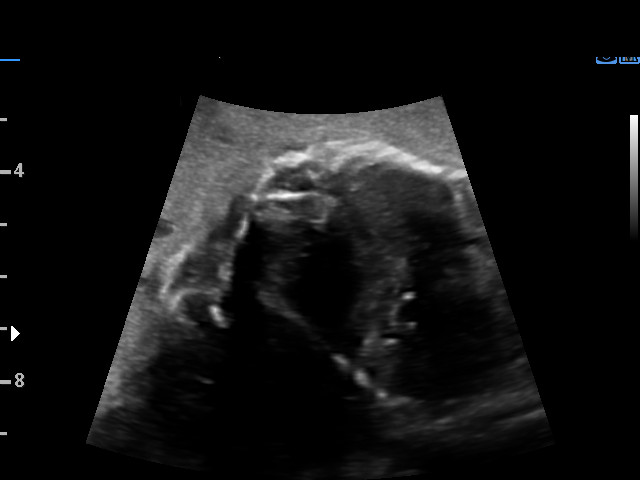
[im 37/76]
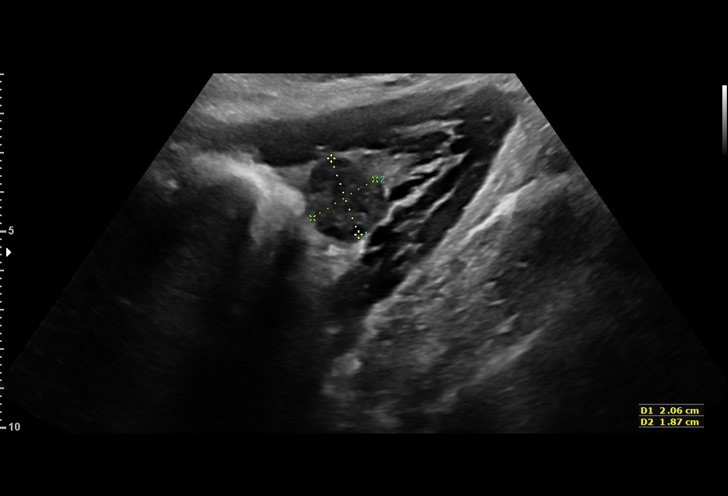
[im 42/76]
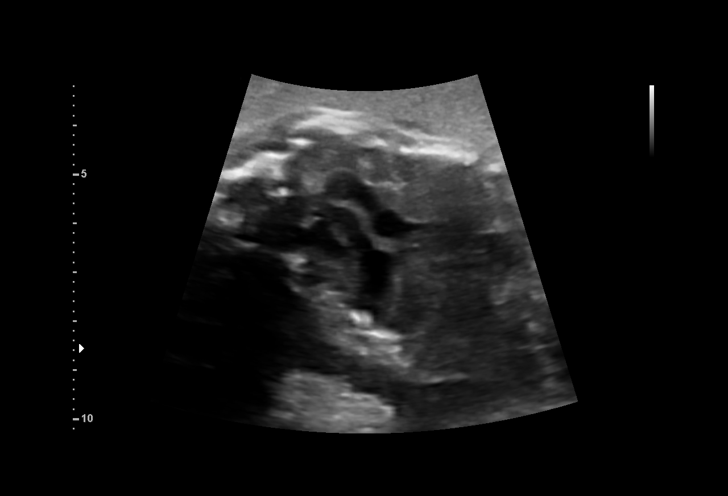
[im 48/76]
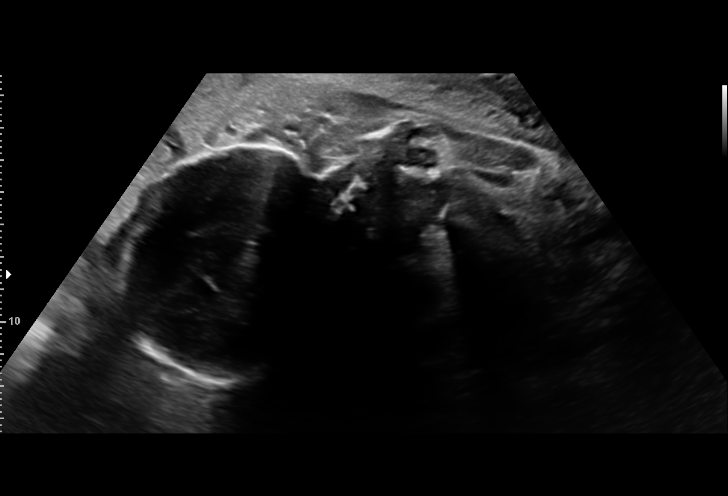
[im 53/76]
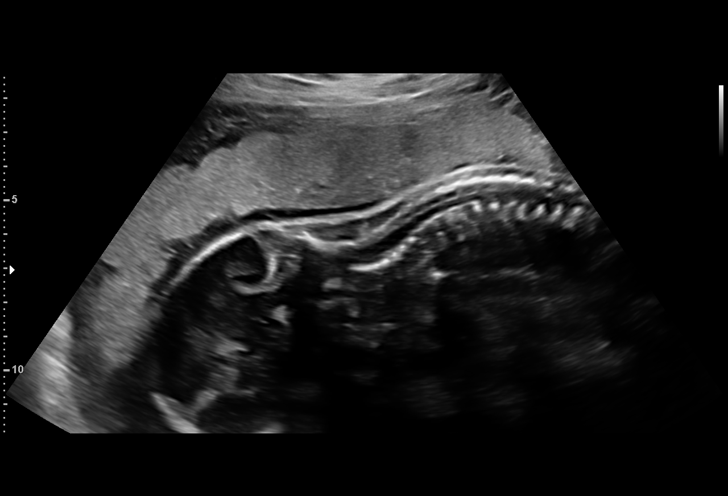
[im 59/76]
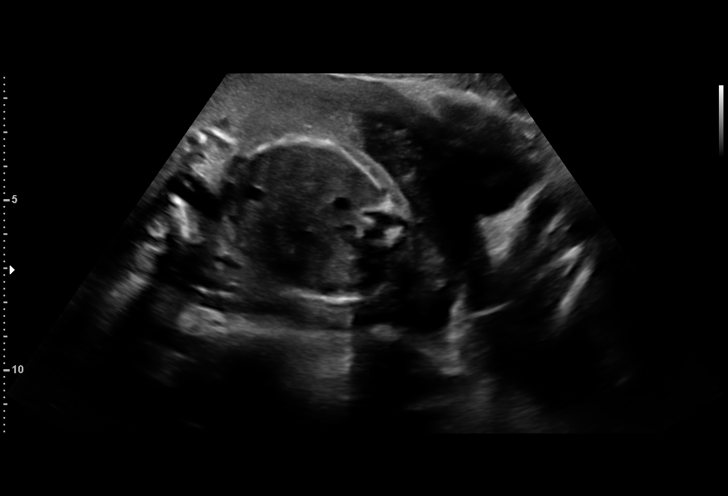
[im 64/76]
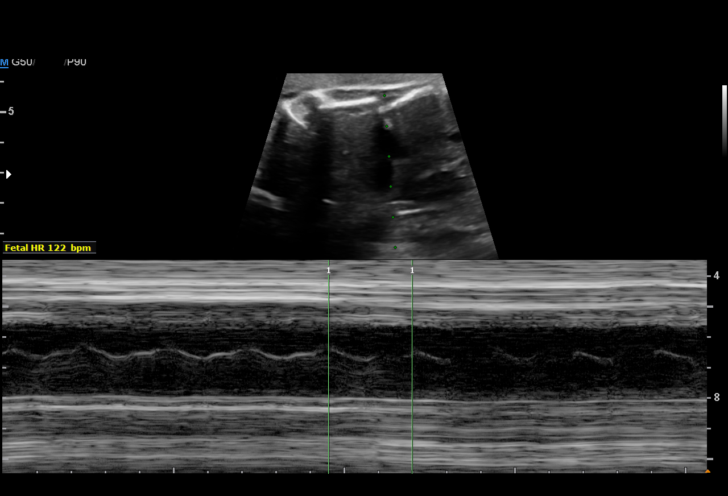
[im 70/76]
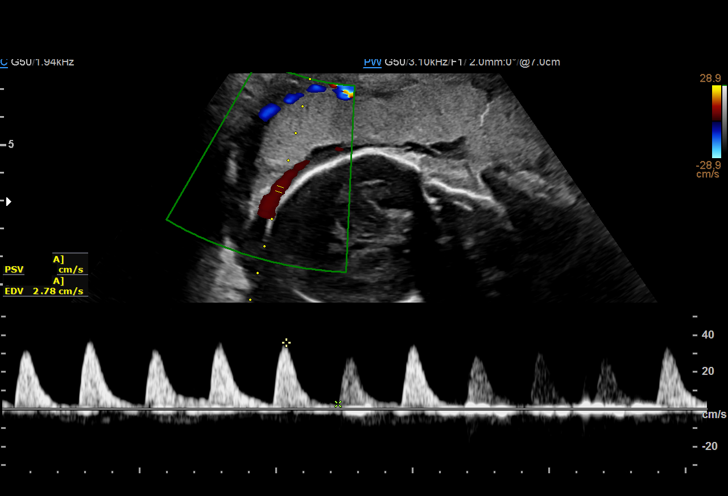
[im 76/76]
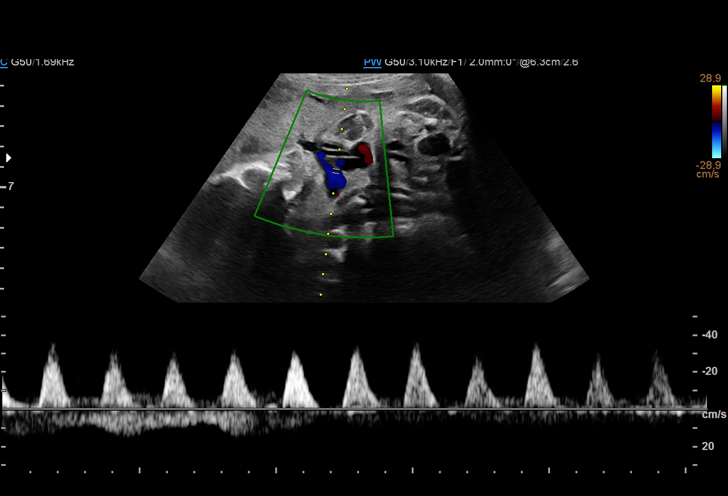

[14 of 28 positions shown; findings below may reference images not displayed]

MOATSHE NP

1  SKULI ABOSE           [id number]      [id number]     [id number]
2  SKULI ABOSE           [id number]      [id number]     [id number]
Indications

29 weeks gestation of pregnancy
Encounter for antenatal screening for
malformations
Non-reactive NST, FHR decelerations
Fetal Evaluation

Num Of Fetuses:     1
Fetal Heart         128
Rate(bpm):
Cardiac Activity:   Observed
Presentation:       Breech
Placenta:           Anterior, above cervical os
P. Cord Insertion:  Visualized

Amniotic Fluid
AFI FV:      Severe Oligohydramnios
Biophysical Evaluation

Amniotic F.V:   Oligohydramnios            F. Tone:        Observed
F. Movement:    Observed                   Score:          [DATE]
F. Breathing:   Observed
Biometry

BPD:      53.8  mm     G. Age:  22w 2d          0  %    CI:        71.02   %   70 - 86
FL/HC:      25.5   %   19.2 -
HC:      203.4  mm     G. Age:  22w 3d        < 3  %    HC/AC:      1.16       0.99 -
AC:      175.5  mm     G. Age:  22w 3d        < 3  %    FL/BPD:     96.5   %   71 - 87
FL:       51.9  mm     G. Age:  27w 5d          3  %    FL/AC:      29.6   %   20 - 24
HUM:      41.7  mm     G. Age:  25w 1d        < 5  %
Est. FW:     697  gm      1 lb 9 oz   < 10  %
Gestational Age

Clinical EDD:  29w 4d                                        EDD:   04/08/16
U/S Today:     23w 5d                                        EDD:   05/19/16
Best:          29w 4d    Det. By:   Clinical EDD             EDD:   04/08/16
Anatomy

Cranium:               Appears normal         Aortic Arch:            Not well visualized
Cavum:                 Not well visualized    Ductal Arch:            Not well visualized
Ventricles:            Appears normal         Diaphragm:              Not well visualized
Choroid Plexus:        Not well visualized    Stomach:                Appears normal, left
sided
Cerebellum:            Not well visualized    Abdomen:                Appears normal
Posterior Fossa:       Not well visualized    Abdominal Wall:         Not well visualized
Nuchal Fold:           Not applicable (>20    Cord Vessels:           Appears normal (3
wks GA)                                        vessel cord)
Face:                  Not well visualized    Kidneys:                Not well visualized
Lips:                  Not well visualized    Bladder:                Appears normal
Thoracic:              Appears normal         Spine:                  Limited views
appear normal
Heart:                 Appears normal         Upper Extremities:      Not well visualized
(4CH, axis, and situs
RVOT:                  Appears normal         Lower Extremities:      Not well visualized
LVOT:                  Appears normal

Other:  Gender not well visualized. Technically difficult due to low amniotic
fluid and fetal position
Doppler - Fetal Vessels

Umbilical Artery
ADFV
Yes

Cervix Uterus Adnexa

Cervix
Length:              3  cm.
Normal appearance by transabdominal scan.

Uterus
No abnormality visualized.

Left Ovary
Within normal limits.

Right Ovary
Within normal limits.
Impression

SIUP at ? 29+ weeks
Breech presentation
No gross abnormalities identified
Severe oligohydramnios
Measurements consistent with a GA of 23+5 weeks; EFW of
131grams; 1+9
UA dopplers show absent EDF
BPP [DATE] (-2 for fluid)
Most likely severe fetal growth restriction.
Recommendations

Would not hesitate to deliver - deliver based on fetal HR
tracing
Course of BMZ
Magnesium sulfate for neuropx
If tracing will allow, deliver by C/S after BMZ course
